# Patient Record
Sex: Female | Born: 1980
Health system: Southern US, Community
[De-identification: ages and names within clinical notes are randomized; demographics above are authoritative.]

---
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
## 2000-05-12 ENCOUNTER — Other Ambulatory Visit: Admission: RE | Admit: 2000-05-12 | Discharge: 2000-05-12 | Payer: Self-pay | Admitting: Family Medicine

## 2004-09-03 ENCOUNTER — Other Ambulatory Visit: Admission: RE | Admit: 2004-09-03 | Discharge: 2004-09-03 | Payer: Self-pay | Admitting: *Deleted

## 2013-10-12 HISTORY — PX: MINOR HEMORRHOIDECTOMY: SHX6238

## 2016-10-12 HISTORY — PX: REPAIR KNEE LIGAMENT: SUR1188

## 2017-07-29 ENCOUNTER — Ambulatory Visit (INDEPENDENT_AMBULATORY_CARE_PROVIDER_SITE_OTHER): Payer: BLUE CROSS/BLUE SHIELD | Admitting: Sports Medicine

## 2017-07-29 ENCOUNTER — Encounter: Payer: Self-pay | Admitting: Sports Medicine

## 2017-07-29 DIAGNOSIS — M2142 Flat foot [pes planus] (acquired), left foot: Secondary | ICD-10-CM | POA: Diagnosis not present

## 2017-07-29 DIAGNOSIS — M2141 Flat foot [pes planus] (acquired), right foot: Secondary | ICD-10-CM | POA: Diagnosis not present

## 2017-08-02 DIAGNOSIS — M2142 Flat foot [pes planus] (acquired), left foot: Principal | ICD-10-CM

## 2017-08-02 DIAGNOSIS — M2141 Flat foot [pes planus] (acquired), right foot: Secondary | ICD-10-CM | POA: Insufficient documentation

## 2017-08-02 NOTE — Progress Notes (Signed)
   HPI  CC: bilateral foot pain Patient is presenting for assessment and possible custom orthotics. Patient referred to us today by [facility]. Patient states long-standing bilateral foot pain. Pain is made worse with prolonged exercise and ambulation. She denies any recent trauma, injury, or fall. She denies any numbness, weakness, or paresthesias.  Medications/Interventions Tried: NSAIDs, PT  See HPI and/or previous note for associated ROS.  Objective: BP 100/70   Ht 5\' 6"  (1.676 m)   Wt 180 lb (81.6 kg)   BMI 29.05 kg/m  Gen: NAD, well groomed, a/o x3, normal affect.  CV: Well-perfused. Warm.  Resp: Non-labored.  Neuro: Sensation intact throughout. No gross coordination deficits.  Gait: Nonpathologic posture, unremarkable stride without signs of limp or balance issues. Ankle/Foot, Bilateral: TTP noted at the longitudinal arches, and medial plantar calcaneous. No visible erythema, swelling, ecchymosis, or bony deformity. Mild/Mod pes planus deformity. Transverse arch grossly intact; Mild tibiotalar valgus deviation; Range of motion is full in all directions. Strength is 5/5 in all directions. No tenderness at the insertion/body/myotendinous junction of the Achilles tendon; No peroneal tendon tenderness or subluxation; No tenderness on posterior aspects of lateral and medial malleolus; Stable lateral and medial ligaments; Talar dome nontender; Unremarkable calcaneal squeeze; No tenderness over the navicular prominence; No tenderness over cuboid; No pain at base of 5th MT; No tenderness at the distal metatarsals; Able to walk 4 steps.   Custom Orthotics: - Patient was fitted for a standard, cushioned, semi-rigid orthotic. - Orthotic was heated and afterward the patient stood on the orthotic blank positioned on the orthotic stand. - The patient was positioned in subtalar neutral position and 10 degrees of ankle dorsiflexion in a weight bearing stance. - After  completion of molding, a stable base was applied to the orthotic blank. - The blank was ground to a stable position for weight bearing. - Base: Blue EVA - Additional Posting and Padding: none;  - The patient ambulated in these, and they were very comfortable.    Assessment and plan:  Bilateral pes planus patient presenting with bilateral pes planus. Orthotics made today. - F/u PRN   I spent 30 minutes with this patient. Over 50% of visit was spent in counseling and coordination of care for problems with pes planus.   Kathee DeltonIan D McKeag, MD,MS [facility] 08/02/2017 7:41 AM

## 2017-08-02 NOTE — Assessment & Plan Note (Signed)
patient presenting with bilateral pes planus. Orthotics made today. - F/u PRN

## 2017-09-30 ENCOUNTER — Encounter: Payer: BLUE CROSS/BLUE SHIELD | Admitting: Sports Medicine

## 2018-11-17 DIAGNOSIS — L218 Other seborrheic dermatitis: Secondary | ICD-10-CM | POA: Diagnosis not present

## 2018-11-17 DIAGNOSIS — L814 Other melanin hyperpigmentation: Secondary | ICD-10-CM | POA: Diagnosis not present

## 2018-11-17 DIAGNOSIS — D225 Melanocytic nevi of trunk: Secondary | ICD-10-CM | POA: Diagnosis not present

## 2018-11-17 DIAGNOSIS — L810 Postinflammatory hyperpigmentation: Secondary | ICD-10-CM | POA: Diagnosis not present

## 2018-11-22 DIAGNOSIS — J309 Allergic rhinitis, unspecified: Secondary | ICD-10-CM | POA: Insufficient documentation

## 2018-12-02 DIAGNOSIS — M9902 Segmental and somatic dysfunction of thoracic region: Secondary | ICD-10-CM | POA: Diagnosis not present

## 2018-12-02 DIAGNOSIS — M9901 Segmental and somatic dysfunction of cervical region: Secondary | ICD-10-CM | POA: Diagnosis not present

## 2018-12-02 DIAGNOSIS — M546 Pain in thoracic spine: Secondary | ICD-10-CM | POA: Diagnosis not present

## 2018-12-02 DIAGNOSIS — M542 Cervicalgia: Secondary | ICD-10-CM | POA: Diagnosis not present

## 2018-12-30 DIAGNOSIS — M546 Pain in thoracic spine: Secondary | ICD-10-CM | POA: Diagnosis not present

## 2018-12-30 DIAGNOSIS — M542 Cervicalgia: Secondary | ICD-10-CM | POA: Diagnosis not present

## 2018-12-30 DIAGNOSIS — M9902 Segmental and somatic dysfunction of thoracic region: Secondary | ICD-10-CM | POA: Diagnosis not present

## 2018-12-30 DIAGNOSIS — M9901 Segmental and somatic dysfunction of cervical region: Secondary | ICD-10-CM | POA: Diagnosis not present

## 2019-01-27 DIAGNOSIS — M9901 Segmental and somatic dysfunction of cervical region: Secondary | ICD-10-CM | POA: Diagnosis not present

## 2019-01-27 DIAGNOSIS — M546 Pain in thoracic spine: Secondary | ICD-10-CM | POA: Diagnosis not present

## 2019-01-27 DIAGNOSIS — M542 Cervicalgia: Secondary | ICD-10-CM | POA: Diagnosis not present

## 2019-01-27 DIAGNOSIS — M9902 Segmental and somatic dysfunction of thoracic region: Secondary | ICD-10-CM | POA: Diagnosis not present

## 2019-02-22 DIAGNOSIS — M9901 Segmental and somatic dysfunction of cervical region: Secondary | ICD-10-CM | POA: Diagnosis not present

## 2019-02-22 DIAGNOSIS — M546 Pain in thoracic spine: Secondary | ICD-10-CM | POA: Diagnosis not present

## 2019-02-22 DIAGNOSIS — M9902 Segmental and somatic dysfunction of thoracic region: Secondary | ICD-10-CM | POA: Diagnosis not present

## 2019-02-22 DIAGNOSIS — M542 Cervicalgia: Secondary | ICD-10-CM | POA: Diagnosis not present

## 2019-03-02 DIAGNOSIS — M9902 Segmental and somatic dysfunction of thoracic region: Secondary | ICD-10-CM | POA: Diagnosis not present

## 2019-03-02 DIAGNOSIS — M9907 Segmental and somatic dysfunction of upper extremity: Secondary | ICD-10-CM | POA: Diagnosis not present

## 2019-03-02 DIAGNOSIS — M7542 Impingement syndrome of left shoulder: Secondary | ICD-10-CM | POA: Diagnosis not present

## 2019-03-02 DIAGNOSIS — M9901 Segmental and somatic dysfunction of cervical region: Secondary | ICD-10-CM | POA: Diagnosis not present

## 2019-03-07 DIAGNOSIS — M7542 Impingement syndrome of left shoulder: Secondary | ICD-10-CM | POA: Diagnosis not present

## 2019-03-07 DIAGNOSIS — M9907 Segmental and somatic dysfunction of upper extremity: Secondary | ICD-10-CM | POA: Diagnosis not present

## 2019-03-07 DIAGNOSIS — M9902 Segmental and somatic dysfunction of thoracic region: Secondary | ICD-10-CM | POA: Diagnosis not present

## 2019-03-07 DIAGNOSIS — M9901 Segmental and somatic dysfunction of cervical region: Secondary | ICD-10-CM | POA: Diagnosis not present

## 2019-03-14 DIAGNOSIS — M9901 Segmental and somatic dysfunction of cervical region: Secondary | ICD-10-CM | POA: Diagnosis not present

## 2019-03-14 DIAGNOSIS — M9902 Segmental and somatic dysfunction of thoracic region: Secondary | ICD-10-CM | POA: Diagnosis not present

## 2019-03-14 DIAGNOSIS — M7542 Impingement syndrome of left shoulder: Secondary | ICD-10-CM | POA: Diagnosis not present

## 2019-03-14 DIAGNOSIS — M9907 Segmental and somatic dysfunction of upper extremity: Secondary | ICD-10-CM | POA: Diagnosis not present

## 2019-03-24 DIAGNOSIS — M9907 Segmental and somatic dysfunction of upper extremity: Secondary | ICD-10-CM | POA: Diagnosis not present

## 2019-03-24 DIAGNOSIS — M9901 Segmental and somatic dysfunction of cervical region: Secondary | ICD-10-CM | POA: Diagnosis not present

## 2019-03-24 DIAGNOSIS — M7542 Impingement syndrome of left shoulder: Secondary | ICD-10-CM | POA: Diagnosis not present

## 2019-03-24 DIAGNOSIS — M9902 Segmental and somatic dysfunction of thoracic region: Secondary | ICD-10-CM | POA: Diagnosis not present

## 2019-03-31 DIAGNOSIS — M9901 Segmental and somatic dysfunction of cervical region: Secondary | ICD-10-CM | POA: Diagnosis not present

## 2019-03-31 DIAGNOSIS — M7542 Impingement syndrome of left shoulder: Secondary | ICD-10-CM | POA: Diagnosis not present

## 2019-03-31 DIAGNOSIS — M9902 Segmental and somatic dysfunction of thoracic region: Secondary | ICD-10-CM | POA: Diagnosis not present

## 2019-03-31 DIAGNOSIS — M9907 Segmental and somatic dysfunction of upper extremity: Secondary | ICD-10-CM | POA: Diagnosis not present

## 2019-04-13 DIAGNOSIS — M9907 Segmental and somatic dysfunction of upper extremity: Secondary | ICD-10-CM | POA: Diagnosis not present

## 2019-04-13 DIAGNOSIS — M7542 Impingement syndrome of left shoulder: Secondary | ICD-10-CM | POA: Diagnosis not present

## 2019-04-13 DIAGNOSIS — M9902 Segmental and somatic dysfunction of thoracic region: Secondary | ICD-10-CM | POA: Diagnosis not present

## 2019-04-13 DIAGNOSIS — M9901 Segmental and somatic dysfunction of cervical region: Secondary | ICD-10-CM | POA: Diagnosis not present

## 2019-04-28 DIAGNOSIS — M9907 Segmental and somatic dysfunction of upper extremity: Secondary | ICD-10-CM | POA: Diagnosis not present

## 2019-04-28 DIAGNOSIS — M9901 Segmental and somatic dysfunction of cervical region: Secondary | ICD-10-CM | POA: Diagnosis not present

## 2019-04-28 DIAGNOSIS — M9902 Segmental and somatic dysfunction of thoracic region: Secondary | ICD-10-CM | POA: Diagnosis not present

## 2019-04-28 DIAGNOSIS — M7542 Impingement syndrome of left shoulder: Secondary | ICD-10-CM | POA: Diagnosis not present

## 2019-05-19 DIAGNOSIS — M9902 Segmental and somatic dysfunction of thoracic region: Secondary | ICD-10-CM | POA: Diagnosis not present

## 2019-05-19 DIAGNOSIS — M9901 Segmental and somatic dysfunction of cervical region: Secondary | ICD-10-CM | POA: Diagnosis not present

## 2019-05-19 DIAGNOSIS — M7542 Impingement syndrome of left shoulder: Secondary | ICD-10-CM | POA: Diagnosis not present

## 2019-05-19 DIAGNOSIS — M9907 Segmental and somatic dysfunction of upper extremity: Secondary | ICD-10-CM | POA: Diagnosis not present

## 2019-06-09 DIAGNOSIS — M7542 Impingement syndrome of left shoulder: Secondary | ICD-10-CM | POA: Diagnosis not present

## 2019-06-09 DIAGNOSIS — M9901 Segmental and somatic dysfunction of cervical region: Secondary | ICD-10-CM | POA: Diagnosis not present

## 2019-06-09 DIAGNOSIS — M9902 Segmental and somatic dysfunction of thoracic region: Secondary | ICD-10-CM | POA: Diagnosis not present

## 2019-06-09 DIAGNOSIS — M9907 Segmental and somatic dysfunction of upper extremity: Secondary | ICD-10-CM | POA: Diagnosis not present

## 2019-06-23 DIAGNOSIS — M9907 Segmental and somatic dysfunction of upper extremity: Secondary | ICD-10-CM | POA: Diagnosis not present

## 2019-06-23 DIAGNOSIS — M7542 Impingement syndrome of left shoulder: Secondary | ICD-10-CM | POA: Diagnosis not present

## 2019-06-23 DIAGNOSIS — M9901 Segmental and somatic dysfunction of cervical region: Secondary | ICD-10-CM | POA: Diagnosis not present

## 2019-06-23 DIAGNOSIS — M9902 Segmental and somatic dysfunction of thoracic region: Secondary | ICD-10-CM | POA: Diagnosis not present

## 2019-07-20 DIAGNOSIS — M9902 Segmental and somatic dysfunction of thoracic region: Secondary | ICD-10-CM | POA: Diagnosis not present

## 2019-07-20 DIAGNOSIS — M9901 Segmental and somatic dysfunction of cervical region: Secondary | ICD-10-CM | POA: Diagnosis not present

## 2019-07-20 DIAGNOSIS — M9907 Segmental and somatic dysfunction of upper extremity: Secondary | ICD-10-CM | POA: Diagnosis not present

## 2019-07-20 DIAGNOSIS — M7542 Impingement syndrome of left shoulder: Secondary | ICD-10-CM | POA: Diagnosis not present

## 2019-07-26 DIAGNOSIS — R05 Cough: Secondary | ICD-10-CM | POA: Diagnosis not present

## 2019-07-26 DIAGNOSIS — Z20828 Contact with and (suspected) exposure to other viral communicable diseases: Secondary | ICD-10-CM | POA: Diagnosis not present

## 2019-07-27 DIAGNOSIS — Z20828 Contact with and (suspected) exposure to other viral communicable diseases: Secondary | ICD-10-CM | POA: Diagnosis not present

## 2019-09-07 ENCOUNTER — Emergency Department (HOSPITAL_COMMUNITY): Payer: BC Managed Care – PPO

## 2019-09-07 ENCOUNTER — Encounter (HOSPITAL_COMMUNITY): Payer: Self-pay | Admitting: Emergency Medicine

## 2019-09-07 ENCOUNTER — Emergency Department (HOSPITAL_COMMUNITY)
Admission: EM | Admit: 2019-09-07 | Discharge: 2019-09-07 | Disposition: A | Discharge status: Home / Self Care | Payer: BC Managed Care – PPO | Attending: Emergency Medicine | Admitting: Emergency Medicine

## 2019-09-07 DIAGNOSIS — Z23 Encounter for immunization: Secondary | ICD-10-CM | POA: Insufficient documentation

## 2019-09-07 DIAGNOSIS — S51851A Open bite of right forearm, initial encounter: Secondary | ICD-10-CM | POA: Insufficient documentation

## 2019-09-07 DIAGNOSIS — Y999 Unspecified external cause status: Secondary | ICD-10-CM | POA: Insufficient documentation

## 2019-09-07 DIAGNOSIS — W540XXA Bitten by dog, initial encounter: Secondary | ICD-10-CM | POA: Insufficient documentation

## 2019-09-07 DIAGNOSIS — Y939 Activity, unspecified: Secondary | ICD-10-CM | POA: Insufficient documentation

## 2019-09-07 DIAGNOSIS — S51051A Open bite, right elbow, initial encounter: Secondary | ICD-10-CM | POA: Diagnosis not present

## 2019-09-07 DIAGNOSIS — Y929 Unspecified place or not applicable: Secondary | ICD-10-CM | POA: Insufficient documentation

## 2019-09-07 MED ORDER — AMOXICILLIN-POT CLAVULANATE 875-125 MG PO TABS
1.0000 | ORAL_TABLET | Freq: Once | ORAL | Status: AC
  Administered 2019-09-07: 1 via ORAL
  Filled 2019-09-07: qty 1

## 2019-09-07 MED ORDER — LIDOCAINE-EPINEPHRINE-TETRACAINE (LET) TOPICAL GEL
3.0000 mL | Freq: Once | TOPICAL | Status: AC
  Administered 2019-09-07: 3 mL via TOPICAL
  Filled 2019-09-07: qty 3

## 2019-09-07 MED ORDER — LIDOCAINE HCL (PF) 1 % IJ SOLN
5.0000 mL | Freq: Once | INTRAMUSCULAR | Status: DC
  Filled 2019-09-07: qty 5

## 2019-09-07 MED ORDER — NAPROXEN 500 MG PO TABS: 500.0000 mg | ORAL_TABLET | Freq: Two times a day (BID) | ORAL | 0 refills | Status: DC

## 2019-09-07 MED ORDER — AMOXICILLIN-POT CLAVULANATE 875-125 MG PO TABS: 1.0000 | ORAL_TABLET | Freq: Two times a day (BID) | ORAL | 0 refills | Status: DC

## 2019-09-07 MED ORDER — TETANUS-DIPHTH-ACELL PERTUSSIS 5-2.5-18.5 LF-MCG/0.5 IM SUSP
0.5000 mL | Freq: Once | INTRAMUSCULAR | Status: AC
  Administered 2019-09-07: 0.5 mL via INTRAMUSCULAR
  Filled 2019-09-07: qty 0.5

## 2019-09-07 MED ORDER — OXYCODONE-ACETAMINOPHEN 5-325 MG PO TABS
1.0000 | ORAL_TABLET | Freq: Once | ORAL | Status: AC
  Administered 2019-09-07: 1 via ORAL
  Filled 2019-09-07: qty 1

## 2019-09-07 NOTE — ED Notes (Signed)
Patient verbalizes understanding of discharge instructions. Opportunity for questioning and answers were provided. Armband removed by staff, pt discharged from ED ambulatory.   

## 2019-09-07 NOTE — Discharge Instructions (Signed)
You were seen in the ER today after a dog bite to your right elbow.  Your xray did not show fractures/dislocations.  Your tetanus was updated.  As long as the dogs rabies vaccine is up to date you do not need rabies vaccination.  We are starting you on the following medicines:  - Augmentin- take twice per day to prevent infection.  - Naproxen is a nonsteroidal anti-inflammatory medication that will help with pain and swelling. Be sure to take this medication as prescribed with food, 1 pill every 12 hours,  It should be taken with food, as it can cause stomach upset, and more seriously, stomach bleeding. Do not take other nonsteroidal anti-inflammatory medications with this such as Advil, Motrin, Aleve, Mobic, Goodie Powder, or Motrin.    You make take Tylenol per over the counter dosing with these medications.   We have prescribed you new medication(s) today. Discuss the medications prescribed today with your pharmacist as they can have adverse effects and interactions with your other medicines including over the counter and prescribed medications. Seek medical evaluation if you start to experience new or abnormal symptoms after taking one of these medicines, seek care immediately if you start to experience difficulty breathing, feeling of your throat closing, facial swelling, or rash as these could be indications of a more serious allergic reaction  Please apply ice wrapped in a towel to the area 20 minutes on 40 minutes off for the next 48 hours to help with swelling.  Keep wounds clean and dry   Please follow up with primary care within 3 days for a recheck of the wounds.  Return to the ER for new or worsening symptoms including but not limited to increased pain, redness around wounds, drainage from wounds, fever, trouble moving the elbow, or any other concerns.

## 2019-09-07 NOTE — ED Provider Notes (Signed)
[facility] EMERGENCY DEPARTMENT Provider Note   CSN: [id number] Arrival date & time: 09/07/19  1433     History   Chief Complaint Chief Complaint  Patient presents with  . Animal Bite    HPI Daisy Taylor is a 38 y.o. female without significant past medical hx who presents to the ED for evaluation of dog bite to the R elbow which occurred shortly PTA today. Patient states her cousin's dog bit her R elbow area. She has 3 open wounds with discomfort & swelling. No alleviating/aggravating factors. No intervention PTA. L hand dominant. Denies fever, chills, numbness, tingling, or weakness. Unknown last tetanus. Reports dogs rabies vaccine is up to date- was vaccinated last month.      HPI  History reviewed. No pertinent past medical history.  Patient Active Problem List   Diagnosis Date Noted  . Bilateral pes planus 08/02/2017    History reviewed. No pertinent surgical history.   OB History   No obstetric history on file.      Home Medications    Prior to Admission medications   Not on File    Family History No family history on file.  Social History Social History   Tobacco Use  . Smoking status: Never Smoker  . Smokeless tobacco: Never Used  Substance Use Topics  . Alcohol use: Not on file  . Drug use: Not on file     Allergies   Bee venom   Review of Systems Review of Systems  Constitutional: Negative for chills and fever.  Gastrointestinal: Negative for abdominal pain, nausea and vomiting.  Musculoskeletal: Positive for arthralgias and joint swelling.  Skin: Positive for wound.  Neurological: Negative for weakness and numbness.    Physical Exam Updated Vital Signs BP 130/87 (BP Location: Left Arm)   Pulse (!) 108   Temp 99.1 F (37.3 C) (Oral)   Resp 20   SpO2 100%   Physical Exam Vitals signs and nursing note reviewed.  Constitutional:      General: She is not in acute distress.    Appearance: Normal  appearance. She is not ill-appearing or toxic-appearing.  HENT:     Head: Normocephalic and atraumatic.  Neck:     Musculoskeletal: Normal range of motion and neck supple.     Comments: No midline tenderness.  Cardiovascular:     Rate and Rhythm: Normal rate.     Pulses:          Radial pulses are 2+ on the right side and 2+ on the left side.  Pulmonary:     Effort: No respiratory distress.     Breath sounds: Normal breath sounds.  Musculoskeletal:     Comments: Upper extremities: R elbow: 3 wounds to medial elbow. Largest is most distal and is 1 cm in diameter. Additional two wounds are puncture like. Each appear to be a few mm deep. Larges with some SQ tissue exposed. Pictured below. No active bleeding. No appreciable FBs. Mild associated swelling/bruising present. Intact AROM throughout. Tender over wounds diffusely. Otherwise nontender. NVI distally.   Skin:    General: Skin is warm and dry.     Capillary Refill: Capillary refill takes less than 2 seconds.  Neurological:     Mental Status: She is alert.     Comments: Alert. Clear speech. Sensation grossly intact to bilateral upper extremities. 5/5 symmetric grip strength. Ambulatory. Able to perform OK sign, thumbs up, & cross 2nd/3rd digits bilaterally.   Psychiatric:  Mood and Affect: Mood normal.        Behavior: Behavior normal.          ED Treatments / Results  Labs (all labs ordered are listed, but only abnormal results are displayed) Labs Reviewed - No data to display  EKG None  Radiology Dg Elbow Complete Right  Result Date: 09/07/2019 CLINICAL DATA:  Pain. Dog bite. EXAM: RIGHT ELBOW - COMPLETE 3+ VIEW COMPARISON:  None. FINDINGS: There is a soft tissue defect involving the ulnar aspect of the elbow. There is no radiopaque foreign body. Subcutaneous gas is noted. There is associated soft tissue swelling. There is no acute displaced fracture or dislocation. There is no elbow joint effusion. IMPRESSION:  Soft tissue defect with subcutaneous gas. No radiopaque foreign body. No acute fracture or dislocation. No elbow joint effusion. Electronically Signed   By: Katherine Mantle M.D.   On: 09/07/2019 15:42    Procedures Procedures (including critical care time)  Medications Ordered in ED Medications  oxyCODONE-acetaminophen (PERCOCET/ROXICET) 5-325 MG per tablet 1 tablet (1 tablet Oral Given 09/07/19 1528)  lidocaine-EPINEPHrine-tetracaine (LET) topical gel (3 mLs Topical Given 09/07/19 1535)  Tdap (BOOSTRIX) injection 0.5 mL (0.5 mLs Intramuscular Given 09/07/19 1528)  amoxicillin-clavulanate (AUGMENTIN) 875-125 MG per tablet 1 tablet (1 tablet Oral Given 09/07/19 1528)    Initial Impression / Assessment and Plan / ED Course  I have reviewed the triage vital signs and the nursing notes.  Pertinent labs & imaging results that were available during my care of the patient were reviewed by me and considered in my medical decision making (see chart for details).   Patient presents to the ED s/p dog bite injury to the right elbow.  Wounds cleansed with betadine, pressure irrigated with 1L of sterile water, visualized in bloodless field without evidence of FB. Given not on the face or overly large closure with sutures was deferred which patient was in agreement with. Abx ointment and bandages applied. NVI distally. Tetanus updated. Dog's rabies vaccines were reported up to date. Start on augmentin, naproxen prescription for pain/swelling. I discussed results, treatment plan, need for follow-up, and return precautions with the patient. Provided opportunity for questions, patient confirmed understanding and is in agreement with plan.    Final Clinical Impressions(s) / ED Diagnoses   Final diagnoses:  Dog bite, initial encounter    ED Discharge Orders         Ordered    amoxicillin-clavulanate (AUGMENTIN) 875-125 MG tablet  Every 12 hours     09/07/19 1610    naproxen (NAPROSYN) 500 MG tablet   2 times daily     09/07/19 [address], [place], PA-C 09/07/19 1626    Arby Barrette, MD 09/08/19 2359

## 2019-09-07 NOTE — ED Triage Notes (Signed)
Pt in w/dog bite to R arm, 3 marks present to R elbow region. Pt states it was family member's bulldog, up to date with all vaccinations. Bleeding controlled

## 2019-09-11 DIAGNOSIS — L03119 Cellulitis of unspecified part of limb: Secondary | ICD-10-CM | POA: Diagnosis not present

## 2019-09-11 DIAGNOSIS — Z6830 Body mass index (BMI) 30.0-30.9, adult: Secondary | ICD-10-CM | POA: Diagnosis not present

## 2019-09-11 DIAGNOSIS — S41159A Open bite of unspecified upper arm, initial encounter: Secondary | ICD-10-CM | POA: Diagnosis not present

## 2019-09-13 DIAGNOSIS — L03119 Cellulitis of unspecified part of limb: Secondary | ICD-10-CM | POA: Diagnosis not present

## 2019-09-13 DIAGNOSIS — S41159A Open bite of unspecified upper arm, initial encounter: Secondary | ICD-10-CM | POA: Diagnosis not present

## 2019-09-13 DIAGNOSIS — Z6831 Body mass index (BMI) 31.0-31.9, adult: Secondary | ICD-10-CM | POA: Diagnosis not present

## 2019-09-29 DIAGNOSIS — M9907 Segmental and somatic dysfunction of upper extremity: Secondary | ICD-10-CM | POA: Diagnosis not present

## 2019-09-29 DIAGNOSIS — M7542 Impingement syndrome of left shoulder: Secondary | ICD-10-CM | POA: Diagnosis not present

## 2019-09-29 DIAGNOSIS — M9902 Segmental and somatic dysfunction of thoracic region: Secondary | ICD-10-CM | POA: Diagnosis not present

## 2019-09-29 DIAGNOSIS — M9901 Segmental and somatic dysfunction of cervical region: Secondary | ICD-10-CM | POA: Diagnosis not present

## 2019-10-19 DIAGNOSIS — M5412 Radiculopathy, cervical region: Secondary | ICD-10-CM | POA: Diagnosis not present

## 2019-10-24 DIAGNOSIS — M542 Cervicalgia: Secondary | ICD-10-CM | POA: Diagnosis not present

## 2019-10-26 DIAGNOSIS — M5000 Cervical disc disorder with myelopathy, unspecified cervical region: Secondary | ICD-10-CM | POA: Diagnosis not present

## 2019-10-27 DIAGNOSIS — M9901 Segmental and somatic dysfunction of cervical region: Secondary | ICD-10-CM | POA: Diagnosis not present

## 2019-10-27 DIAGNOSIS — M7542 Impingement syndrome of left shoulder: Secondary | ICD-10-CM | POA: Diagnosis not present

## 2019-10-27 DIAGNOSIS — M9907 Segmental and somatic dysfunction of upper extremity: Secondary | ICD-10-CM | POA: Diagnosis not present

## 2019-10-27 DIAGNOSIS — M9902 Segmental and somatic dysfunction of thoracic region: Secondary | ICD-10-CM | POA: Diagnosis not present

## 2019-11-08 DIAGNOSIS — M9901 Segmental and somatic dysfunction of cervical region: Secondary | ICD-10-CM | POA: Diagnosis not present

## 2019-11-08 DIAGNOSIS — M9907 Segmental and somatic dysfunction of upper extremity: Secondary | ICD-10-CM | POA: Diagnosis not present

## 2019-11-08 DIAGNOSIS — M9902 Segmental and somatic dysfunction of thoracic region: Secondary | ICD-10-CM | POA: Diagnosis not present

## 2019-11-08 DIAGNOSIS — M7542 Impingement syndrome of left shoulder: Secondary | ICD-10-CM | POA: Diagnosis not present

## 2019-11-21 DIAGNOSIS — M9901 Segmental and somatic dysfunction of cervical region: Secondary | ICD-10-CM | POA: Diagnosis not present

## 2019-11-21 DIAGNOSIS — M9907 Segmental and somatic dysfunction of upper extremity: Secondary | ICD-10-CM | POA: Diagnosis not present

## 2019-11-21 DIAGNOSIS — M7542 Impingement syndrome of left shoulder: Secondary | ICD-10-CM | POA: Diagnosis not present

## 2019-11-21 DIAGNOSIS — M9902 Segmental and somatic dysfunction of thoracic region: Secondary | ICD-10-CM | POA: Diagnosis not present

## 2019-11-27 DIAGNOSIS — L814 Other melanin hyperpigmentation: Secondary | ICD-10-CM | POA: Diagnosis not present

## 2019-11-27 DIAGNOSIS — L810 Postinflammatory hyperpigmentation: Secondary | ICD-10-CM | POA: Diagnosis not present

## 2019-11-27 DIAGNOSIS — D225 Melanocytic nevi of trunk: Secondary | ICD-10-CM | POA: Diagnosis not present

## 2019-11-27 DIAGNOSIS — L218 Other seborrheic dermatitis: Secondary | ICD-10-CM | POA: Diagnosis not present

## 2019-12-13 DIAGNOSIS — M9901 Segmental and somatic dysfunction of cervical region: Secondary | ICD-10-CM | POA: Diagnosis not present

## 2019-12-13 DIAGNOSIS — M7542 Impingement syndrome of left shoulder: Secondary | ICD-10-CM | POA: Diagnosis not present

## 2019-12-13 DIAGNOSIS — M9907 Segmental and somatic dysfunction of upper extremity: Secondary | ICD-10-CM | POA: Diagnosis not present

## 2019-12-13 DIAGNOSIS — M9902 Segmental and somatic dysfunction of thoracic region: Secondary | ICD-10-CM | POA: Diagnosis not present

## 2019-12-28 DIAGNOSIS — M9907 Segmental and somatic dysfunction of upper extremity: Secondary | ICD-10-CM | POA: Diagnosis not present

## 2019-12-28 DIAGNOSIS — M9902 Segmental and somatic dysfunction of thoracic region: Secondary | ICD-10-CM | POA: Diagnosis not present

## 2019-12-28 DIAGNOSIS — M9901 Segmental and somatic dysfunction of cervical region: Secondary | ICD-10-CM | POA: Diagnosis not present

## 2019-12-28 DIAGNOSIS — M7542 Impingement syndrome of left shoulder: Secondary | ICD-10-CM | POA: Diagnosis not present

## 2020-01-16 DIAGNOSIS — M9902 Segmental and somatic dysfunction of thoracic region: Secondary | ICD-10-CM | POA: Diagnosis not present

## 2020-01-16 DIAGNOSIS — M9901 Segmental and somatic dysfunction of cervical region: Secondary | ICD-10-CM | POA: Diagnosis not present

## 2020-01-16 DIAGNOSIS — M9907 Segmental and somatic dysfunction of upper extremity: Secondary | ICD-10-CM | POA: Diagnosis not present

## 2020-01-16 DIAGNOSIS — M7542 Impingement syndrome of left shoulder: Secondary | ICD-10-CM | POA: Diagnosis not present

## 2020-01-18 ENCOUNTER — Ambulatory Visit: Payer: BC Managed Care – PPO | Attending: Internal Medicine

## 2020-01-18 DIAGNOSIS — Z23 Encounter for immunization: Secondary | ICD-10-CM

## 2020-01-18 NOTE — Progress Notes (Signed)
   Covid-19 Vaccination Clinic  Name:  ENAYA HOWZE    MRN: [id number] DOB: 1981-06-26  01/18/2020  Ms. Dowell was observed post Covid-19 immunization for 15 minutes without incident. She was provided with Vaccine Information Sheet and instruction to access the V-Safe system.   Ms. Cofield was instructed to call 911 with any severe reactions post vaccine: Marland Kitchen Difficulty breathing  . Swelling of face and throat  . A fast heartbeat  . A bad rash all over body  . Dizziness and weakness   Immunizations Administered    Name Date Dose VIS Date Route   Pfizer COVID-19 Vaccine 01/18/2020  1:05 PM 0.3 mL 09/22/2019 Intramuscular   Manufacturer: [employer], [employer]   Lot: [id number]   NDC: [address]-3

## 2020-01-22 DIAGNOSIS — L810 Postinflammatory hyperpigmentation: Secondary | ICD-10-CM | POA: Diagnosis not present

## 2020-01-22 DIAGNOSIS — L218 Other seborrheic dermatitis: Secondary | ICD-10-CM | POA: Diagnosis not present

## 2020-01-22 DIAGNOSIS — L659 Nonscarring hair loss, unspecified: Secondary | ICD-10-CM | POA: Diagnosis not present

## 2020-01-22 DIAGNOSIS — L811 Chloasma: Secondary | ICD-10-CM | POA: Diagnosis not present

## 2020-01-30 DIAGNOSIS — M7542 Impingement syndrome of left shoulder: Secondary | ICD-10-CM | POA: Diagnosis not present

## 2020-01-30 DIAGNOSIS — M9907 Segmental and somatic dysfunction of upper extremity: Secondary | ICD-10-CM | POA: Diagnosis not present

## 2020-01-30 DIAGNOSIS — M9902 Segmental and somatic dysfunction of thoracic region: Secondary | ICD-10-CM | POA: Diagnosis not present

## 2020-01-30 DIAGNOSIS — M9901 Segmental and somatic dysfunction of cervical region: Secondary | ICD-10-CM | POA: Diagnosis not present

## 2020-02-12 ENCOUNTER — Ambulatory Visit: Payer: BC Managed Care – PPO | Attending: Internal Medicine

## 2020-02-12 DIAGNOSIS — Z23 Encounter for immunization: Secondary | ICD-10-CM

## 2020-02-12 NOTE — Progress Notes (Signed)
   Covid-19 Vaccination Clinic  Name:  Daisy Taylor    MRN: [id number] DOB: Dec 27, 1980  02/12/2020  [name] was observed post Covid-19 immunization for 15 minutes without incident. She was provided with Vaccine Information Sheet and instruction to access the V-Safe system.   [name] was instructed to call 911 with any severe reactions post vaccine: Marland Kitchen Difficulty breathing  . Swelling of face and throat  . A fast heartbeat  . A bad rash all over body  . Dizziness and weakness   Immunizations Administered    Name Date Dose VIS Date Route   Pfizer COVID-19 Vaccine 02/12/2020  2:18 PM 0.3 mL 12/06/2018 Intramuscular   Manufacturer: [employer], [employer]   Lot: [id number]   NDC: [address]-3

## 2020-02-27 DIAGNOSIS — M9901 Segmental and somatic dysfunction of cervical region: Secondary | ICD-10-CM | POA: Diagnosis not present

## 2020-02-27 DIAGNOSIS — M7542 Impingement syndrome of left shoulder: Secondary | ICD-10-CM | POA: Diagnosis not present

## 2020-02-27 DIAGNOSIS — M9902 Segmental and somatic dysfunction of thoracic region: Secondary | ICD-10-CM | POA: Diagnosis not present

## 2020-02-27 DIAGNOSIS — M9907 Segmental and somatic dysfunction of upper extremity: Secondary | ICD-10-CM | POA: Diagnosis not present

## 2020-03-12 DIAGNOSIS — M7542 Impingement syndrome of left shoulder: Secondary | ICD-10-CM | POA: Diagnosis not present

## 2020-03-12 DIAGNOSIS — M9902 Segmental and somatic dysfunction of thoracic region: Secondary | ICD-10-CM | POA: Diagnosis not present

## 2020-03-12 DIAGNOSIS — M9907 Segmental and somatic dysfunction of upper extremity: Secondary | ICD-10-CM | POA: Diagnosis not present

## 2020-03-12 DIAGNOSIS — M9901 Segmental and somatic dysfunction of cervical region: Secondary | ICD-10-CM | POA: Diagnosis not present

## 2020-04-02 DIAGNOSIS — M9902 Segmental and somatic dysfunction of thoracic region: Secondary | ICD-10-CM | POA: Diagnosis not present

## 2020-04-02 DIAGNOSIS — M7542 Impingement syndrome of left shoulder: Secondary | ICD-10-CM | POA: Diagnosis not present

## 2020-04-02 DIAGNOSIS — M9901 Segmental and somatic dysfunction of cervical region: Secondary | ICD-10-CM | POA: Diagnosis not present

## 2020-04-02 DIAGNOSIS — M9907 Segmental and somatic dysfunction of upper extremity: Secondary | ICD-10-CM | POA: Diagnosis not present

## 2020-04-03 DIAGNOSIS — L811 Chloasma: Secondary | ICD-10-CM | POA: Diagnosis not present

## 2020-04-03 DIAGNOSIS — L218 Other seborrheic dermatitis: Secondary | ICD-10-CM | POA: Diagnosis not present

## 2020-04-03 DIAGNOSIS — L659 Nonscarring hair loss, unspecified: Secondary | ICD-10-CM | POA: Diagnosis not present

## 2020-04-03 DIAGNOSIS — L810 Postinflammatory hyperpigmentation: Secondary | ICD-10-CM | POA: Diagnosis not present

## 2020-04-23 DIAGNOSIS — M9901 Segmental and somatic dysfunction of cervical region: Secondary | ICD-10-CM | POA: Diagnosis not present

## 2020-04-23 DIAGNOSIS — M7542 Impingement syndrome of left shoulder: Secondary | ICD-10-CM | POA: Diagnosis not present

## 2020-04-23 DIAGNOSIS — M9907 Segmental and somatic dysfunction of upper extremity: Secondary | ICD-10-CM | POA: Diagnosis not present

## 2020-04-23 DIAGNOSIS — M9902 Segmental and somatic dysfunction of thoracic region: Secondary | ICD-10-CM | POA: Diagnosis not present

## 2020-05-07 DIAGNOSIS — M9902 Segmental and somatic dysfunction of thoracic region: Secondary | ICD-10-CM | POA: Diagnosis not present

## 2020-05-07 DIAGNOSIS — M7542 Impingement syndrome of left shoulder: Secondary | ICD-10-CM | POA: Diagnosis not present

## 2020-05-07 DIAGNOSIS — M9907 Segmental and somatic dysfunction of upper extremity: Secondary | ICD-10-CM | POA: Diagnosis not present

## 2020-05-07 DIAGNOSIS — M9901 Segmental and somatic dysfunction of cervical region: Secondary | ICD-10-CM | POA: Diagnosis not present

## 2020-05-28 DIAGNOSIS — M9907 Segmental and somatic dysfunction of upper extremity: Secondary | ICD-10-CM | POA: Diagnosis not present

## 2020-05-28 DIAGNOSIS — M9901 Segmental and somatic dysfunction of cervical region: Secondary | ICD-10-CM | POA: Diagnosis not present

## 2020-05-28 DIAGNOSIS — M9902 Segmental and somatic dysfunction of thoracic region: Secondary | ICD-10-CM | POA: Diagnosis not present

## 2020-05-28 DIAGNOSIS — M7542 Impingement syndrome of left shoulder: Secondary | ICD-10-CM | POA: Diagnosis not present

## 2020-06-25 DIAGNOSIS — M9907 Segmental and somatic dysfunction of upper extremity: Secondary | ICD-10-CM | POA: Diagnosis not present

## 2020-06-25 DIAGNOSIS — M9901 Segmental and somatic dysfunction of cervical region: Secondary | ICD-10-CM | POA: Diagnosis not present

## 2020-06-25 DIAGNOSIS — M7542 Impingement syndrome of left shoulder: Secondary | ICD-10-CM | POA: Diagnosis not present

## 2020-06-25 DIAGNOSIS — M9902 Segmental and somatic dysfunction of thoracic region: Secondary | ICD-10-CM | POA: Diagnosis not present

## 2020-07-09 DIAGNOSIS — M7542 Impingement syndrome of left shoulder: Secondary | ICD-10-CM | POA: Diagnosis not present

## 2020-07-09 DIAGNOSIS — M9902 Segmental and somatic dysfunction of thoracic region: Secondary | ICD-10-CM | POA: Diagnosis not present

## 2020-07-09 DIAGNOSIS — M9907 Segmental and somatic dysfunction of upper extremity: Secondary | ICD-10-CM | POA: Diagnosis not present

## 2020-07-09 DIAGNOSIS — M9901 Segmental and somatic dysfunction of cervical region: Secondary | ICD-10-CM | POA: Diagnosis not present

## 2020-07-31 DIAGNOSIS — M7542 Impingement syndrome of left shoulder: Secondary | ICD-10-CM | POA: Diagnosis not present

## 2020-07-31 DIAGNOSIS — M9901 Segmental and somatic dysfunction of cervical region: Secondary | ICD-10-CM | POA: Diagnosis not present

## 2020-07-31 DIAGNOSIS — M9902 Segmental and somatic dysfunction of thoracic region: Secondary | ICD-10-CM | POA: Diagnosis not present

## 2020-07-31 DIAGNOSIS — M9907 Segmental and somatic dysfunction of upper extremity: Secondary | ICD-10-CM | POA: Diagnosis not present

## 2020-08-21 DIAGNOSIS — M7542 Impingement syndrome of left shoulder: Secondary | ICD-10-CM | POA: Diagnosis not present

## 2020-08-21 DIAGNOSIS — M9901 Segmental and somatic dysfunction of cervical region: Secondary | ICD-10-CM | POA: Diagnosis not present

## 2020-08-21 DIAGNOSIS — M9902 Segmental and somatic dysfunction of thoracic region: Secondary | ICD-10-CM | POA: Diagnosis not present

## 2020-08-21 DIAGNOSIS — M9907 Segmental and somatic dysfunction of upper extremity: Secondary | ICD-10-CM | POA: Diagnosis not present

## 2020-08-23 ENCOUNTER — Other Ambulatory Visit (HOSPITAL_COMMUNITY)
Admission: RE | Admit: 2020-08-23 | Discharge: 2020-08-23 | Disposition: A | Discharge status: Home / Self Care | Payer: BC Managed Care – PPO | Source: Ambulatory Visit | Attending: Family Medicine | Admitting: Family Medicine

## 2020-08-23 ENCOUNTER — Other Ambulatory Visit: Payer: Self-pay | Admitting: Family Medicine

## 2020-08-23 DIAGNOSIS — Z13220 Encounter for screening for lipoid disorders: Secondary | ICD-10-CM | POA: Diagnosis not present

## 2020-08-23 DIAGNOSIS — Z113 Encounter for screening for infections with a predominantly sexual mode of transmission: Secondary | ICD-10-CM | POA: Diagnosis not present

## 2020-08-23 DIAGNOSIS — Z124 Encounter for screening for malignant neoplasm of cervix: Secondary | ICD-10-CM | POA: Insufficient documentation

## 2020-08-23 DIAGNOSIS — Z0000 Encounter for general adult medical examination without abnormal findings: Secondary | ICD-10-CM | POA: Diagnosis not present

## 2020-08-23 DIAGNOSIS — Z23 Encounter for immunization: Secondary | ICD-10-CM | POA: Diagnosis not present

## 2020-09-09 LAB — CYTOLOGY - PAP
Comment: NEGATIVE
Diagnosis: NEGATIVE
High risk HPV: NEGATIVE

## 2020-09-11 DIAGNOSIS — M9901 Segmental and somatic dysfunction of cervical region: Secondary | ICD-10-CM | POA: Diagnosis not present

## 2020-09-11 DIAGNOSIS — M7542 Impingement syndrome of left shoulder: Secondary | ICD-10-CM | POA: Diagnosis not present

## 2020-09-11 DIAGNOSIS — M9907 Segmental and somatic dysfunction of upper extremity: Secondary | ICD-10-CM | POA: Diagnosis not present

## 2020-09-11 DIAGNOSIS — M9902 Segmental and somatic dysfunction of thoracic region: Secondary | ICD-10-CM | POA: Diagnosis not present

## 2020-10-02 DIAGNOSIS — M9902 Segmental and somatic dysfunction of thoracic region: Secondary | ICD-10-CM | POA: Diagnosis not present

## 2020-10-02 DIAGNOSIS — M7542 Impingement syndrome of left shoulder: Secondary | ICD-10-CM | POA: Diagnosis not present

## 2020-10-02 DIAGNOSIS — M9907 Segmental and somatic dysfunction of upper extremity: Secondary | ICD-10-CM | POA: Diagnosis not present

## 2020-10-02 DIAGNOSIS — M9901 Segmental and somatic dysfunction of cervical region: Secondary | ICD-10-CM | POA: Diagnosis not present

## 2020-10-21 DIAGNOSIS — M7542 Impingement syndrome of left shoulder: Secondary | ICD-10-CM | POA: Diagnosis not present

## 2020-10-21 DIAGNOSIS — M9907 Segmental and somatic dysfunction of upper extremity: Secondary | ICD-10-CM | POA: Diagnosis not present

## 2020-10-21 DIAGNOSIS — M9902 Segmental and somatic dysfunction of thoracic region: Secondary | ICD-10-CM | POA: Diagnosis not present

## 2020-10-21 DIAGNOSIS — M9901 Segmental and somatic dysfunction of cervical region: Secondary | ICD-10-CM | POA: Diagnosis not present

## 2020-11-18 DIAGNOSIS — M7542 Impingement syndrome of left shoulder: Secondary | ICD-10-CM | POA: Diagnosis not present

## 2020-11-18 DIAGNOSIS — M9901 Segmental and somatic dysfunction of cervical region: Secondary | ICD-10-CM | POA: Diagnosis not present

## 2020-11-18 DIAGNOSIS — M9907 Segmental and somatic dysfunction of upper extremity: Secondary | ICD-10-CM | POA: Diagnosis not present

## 2020-11-18 DIAGNOSIS — M9902 Segmental and somatic dysfunction of thoracic region: Secondary | ICD-10-CM | POA: Diagnosis not present

## 2020-11-25 DIAGNOSIS — Z20822 Contact with and (suspected) exposure to covid-19: Secondary | ICD-10-CM | POA: Diagnosis not present

## 2020-12-16 DIAGNOSIS — M9907 Segmental and somatic dysfunction of upper extremity: Secondary | ICD-10-CM | POA: Diagnosis not present

## 2020-12-16 DIAGNOSIS — M7542 Impingement syndrome of left shoulder: Secondary | ICD-10-CM | POA: Diagnosis not present

## 2020-12-16 DIAGNOSIS — M9901 Segmental and somatic dysfunction of cervical region: Secondary | ICD-10-CM | POA: Diagnosis not present

## 2020-12-16 DIAGNOSIS — M9902 Segmental and somatic dysfunction of thoracic region: Secondary | ICD-10-CM | POA: Diagnosis not present

## 2021-01-06 DIAGNOSIS — M9901 Segmental and somatic dysfunction of cervical region: Secondary | ICD-10-CM | POA: Diagnosis not present

## 2021-01-06 DIAGNOSIS — M9907 Segmental and somatic dysfunction of upper extremity: Secondary | ICD-10-CM | POA: Diagnosis not present

## 2021-01-06 DIAGNOSIS — M9902 Segmental and somatic dysfunction of thoracic region: Secondary | ICD-10-CM | POA: Diagnosis not present

## 2021-01-06 DIAGNOSIS — M7542 Impingement syndrome of left shoulder: Secondary | ICD-10-CM | POA: Diagnosis not present

## 2021-03-21 DIAGNOSIS — M9907 Segmental and somatic dysfunction of upper extremity: Secondary | ICD-10-CM | POA: Diagnosis not present

## 2021-03-21 DIAGNOSIS — M9902 Segmental and somatic dysfunction of thoracic region: Secondary | ICD-10-CM | POA: Diagnosis not present

## 2021-03-21 DIAGNOSIS — M9901 Segmental and somatic dysfunction of cervical region: Secondary | ICD-10-CM | POA: Diagnosis not present

## 2021-03-21 DIAGNOSIS — M7542 Impingement syndrome of left shoulder: Secondary | ICD-10-CM | POA: Diagnosis not present

## 2021-03-23 IMAGING — DX DG ELBOW COMPLETE 3+V*R*
4 series · 4 of 4 positions shown · non-contrast
Comparison: None.

CLINICAL DATA: Pain. Dog bite.

EXAM:
RIGHT ELBOW - COMPLETE 3+ VIEW

[elbow ap]
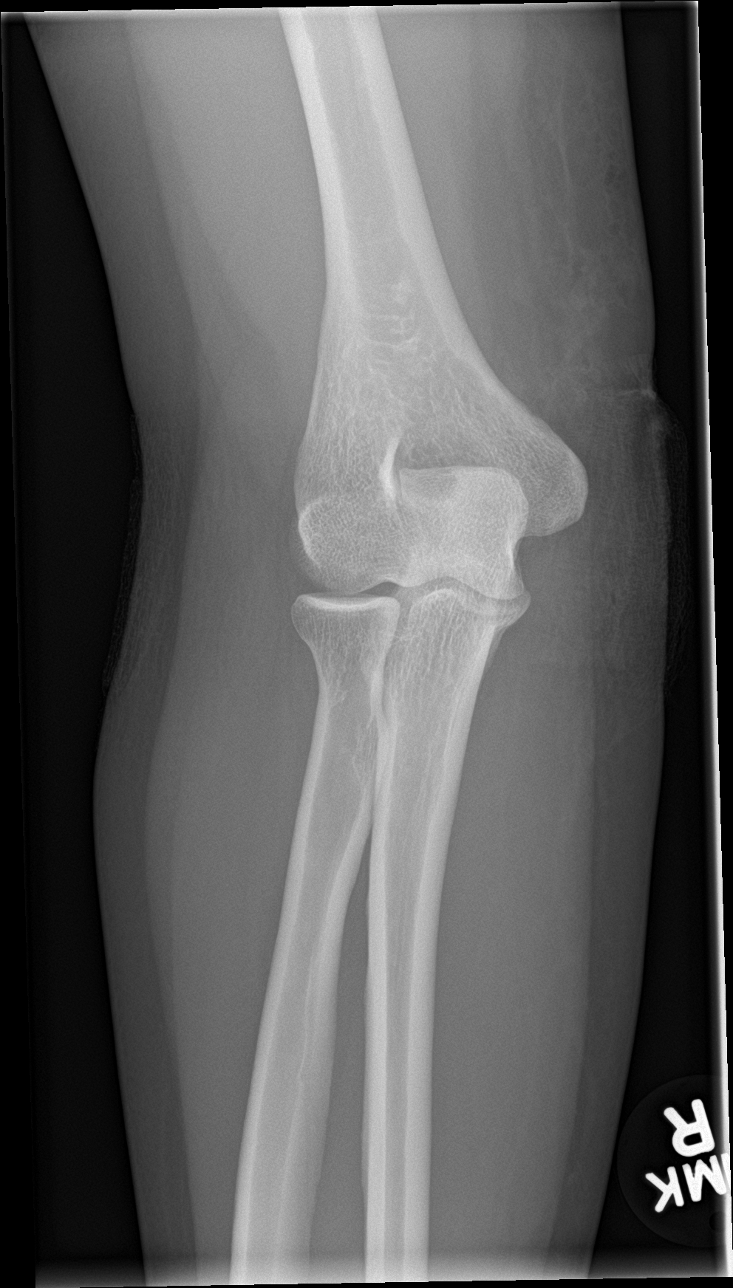

[elbow obl (1 of 2)]
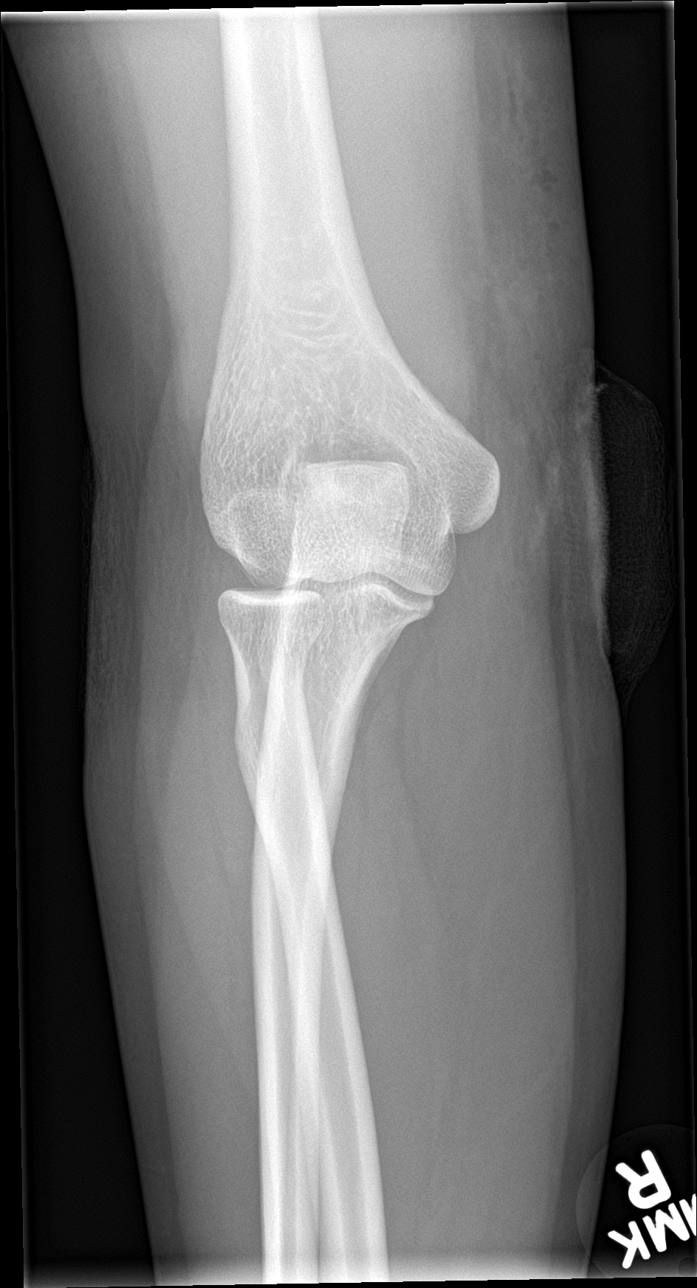

[elbow obl (2 of 2)]
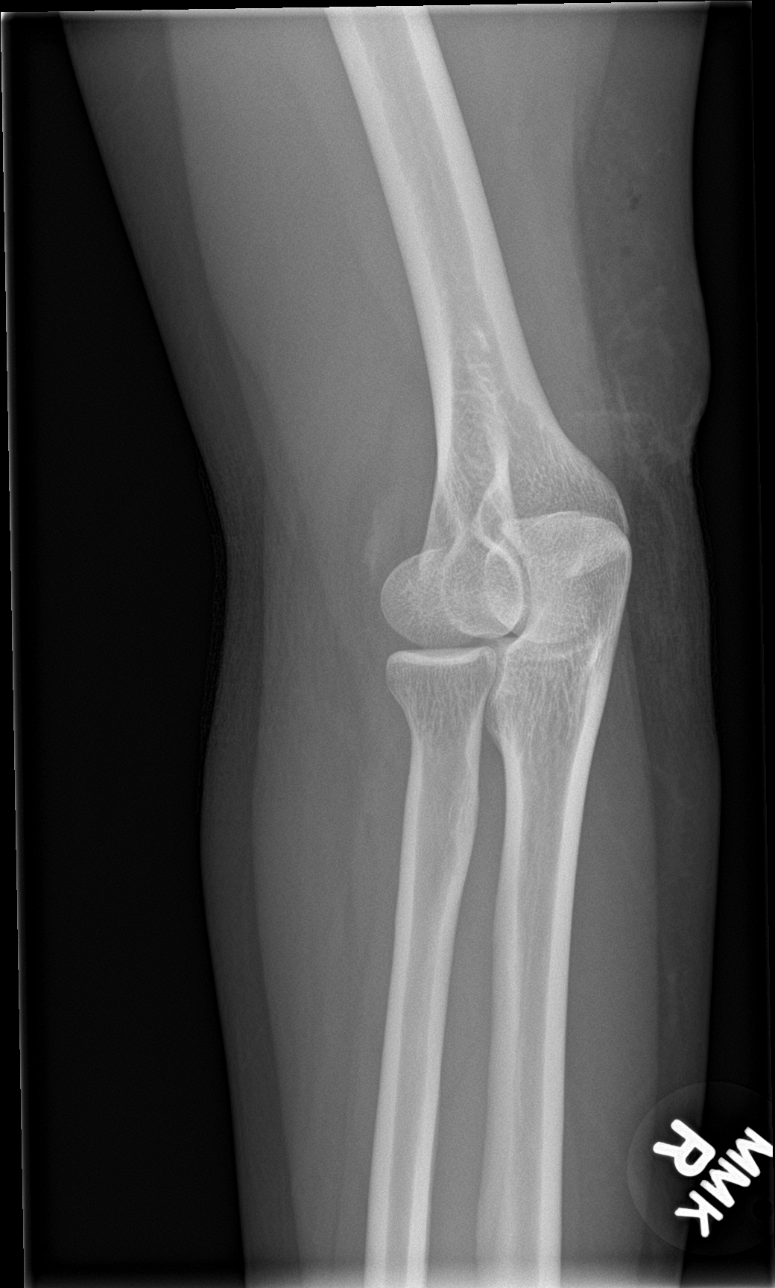

[elbow lat]
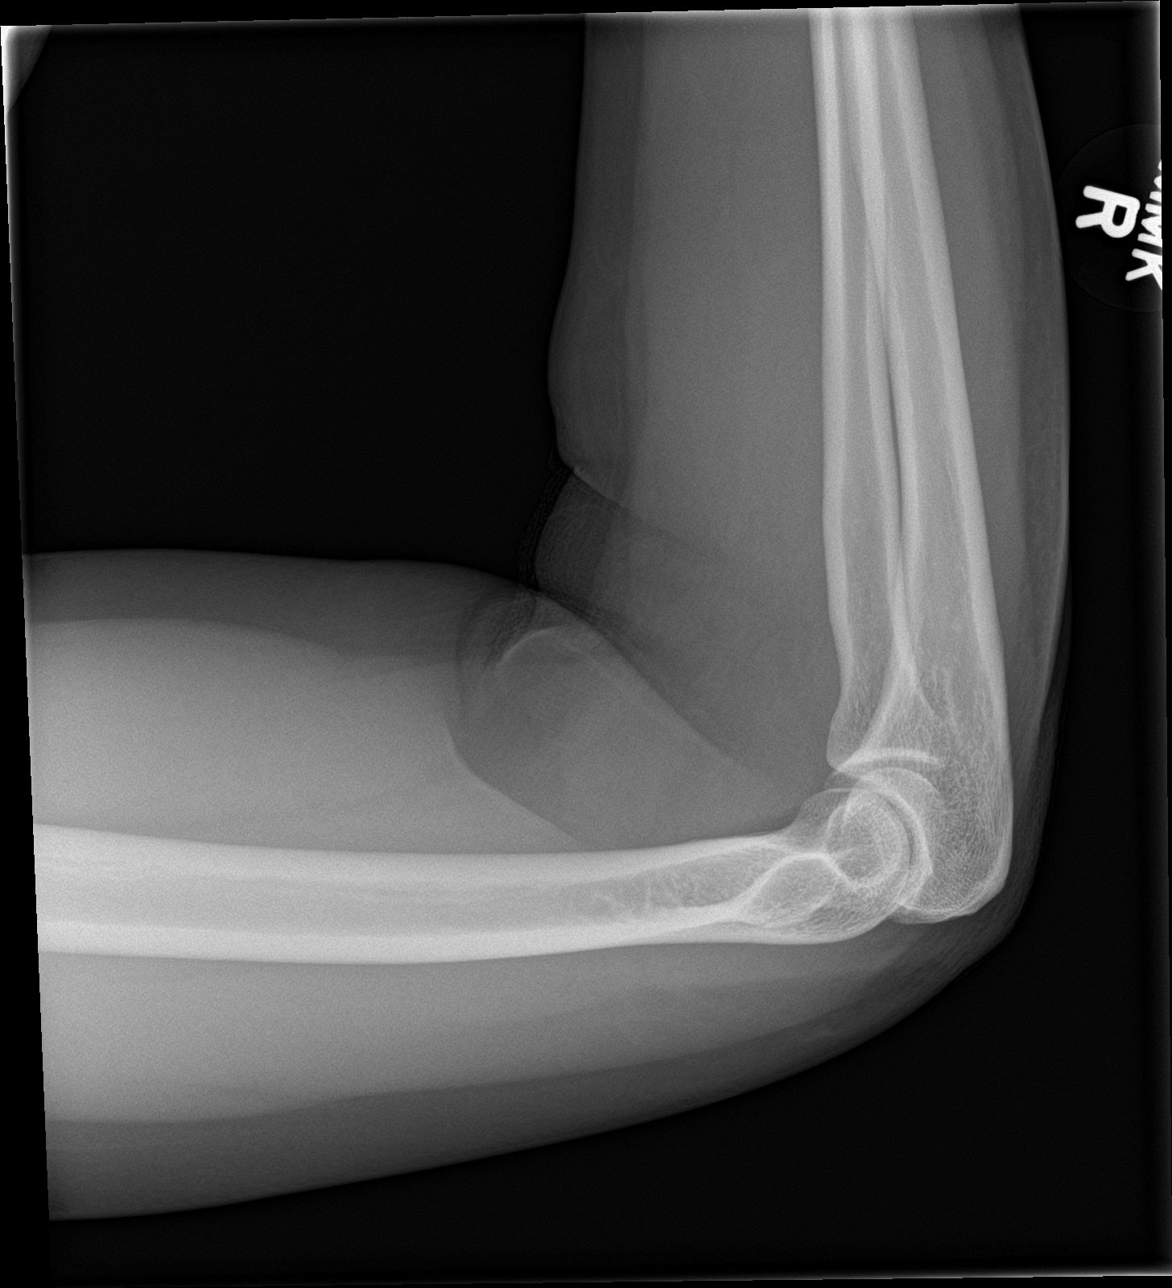

[4 of 4 positions shown; findings below may reference images not displayed]

FINDINGS: There is a soft tissue defect involving the ulnar aspect of the
elbow. There is no radiopaque foreign body. Subcutaneous gas is
noted. There is associated soft tissue swelling. There is no acute
displaced fracture or dislocation. There is no elbow joint effusion.
IMPRESSION: Soft tissue defect with subcutaneous gas. No radiopaque foreign
body. No acute fracture or dislocation. No elbow joint effusion.

## 2021-04-18 DIAGNOSIS — M9901 Segmental and somatic dysfunction of cervical region: Secondary | ICD-10-CM | POA: Diagnosis not present

## 2021-04-18 DIAGNOSIS — M9902 Segmental and somatic dysfunction of thoracic region: Secondary | ICD-10-CM | POA: Diagnosis not present

## 2021-04-18 DIAGNOSIS — M7542 Impingement syndrome of left shoulder: Secondary | ICD-10-CM | POA: Diagnosis not present

## 2021-04-18 DIAGNOSIS — M9907 Segmental and somatic dysfunction of upper extremity: Secondary | ICD-10-CM | POA: Diagnosis not present

## 2021-05-09 DIAGNOSIS — M9901 Segmental and somatic dysfunction of cervical region: Secondary | ICD-10-CM | POA: Diagnosis not present

## 2021-05-09 DIAGNOSIS — M9907 Segmental and somatic dysfunction of upper extremity: Secondary | ICD-10-CM | POA: Diagnosis not present

## 2021-05-09 DIAGNOSIS — M7542 Impingement syndrome of left shoulder: Secondary | ICD-10-CM | POA: Diagnosis not present

## 2021-05-09 DIAGNOSIS — M9902 Segmental and somatic dysfunction of thoracic region: Secondary | ICD-10-CM | POA: Diagnosis not present

## 2021-05-23 DIAGNOSIS — M9901 Segmental and somatic dysfunction of cervical region: Secondary | ICD-10-CM | POA: Diagnosis not present

## 2021-05-23 DIAGNOSIS — M9907 Segmental and somatic dysfunction of upper extremity: Secondary | ICD-10-CM | POA: Diagnosis not present

## 2021-05-23 DIAGNOSIS — M7542 Impingement syndrome of left shoulder: Secondary | ICD-10-CM | POA: Diagnosis not present

## 2021-05-23 DIAGNOSIS — M9902 Segmental and somatic dysfunction of thoracic region: Secondary | ICD-10-CM | POA: Diagnosis not present

## 2021-06-13 DIAGNOSIS — M9901 Segmental and somatic dysfunction of cervical region: Secondary | ICD-10-CM | POA: Diagnosis not present

## 2021-06-13 DIAGNOSIS — M9902 Segmental and somatic dysfunction of thoracic region: Secondary | ICD-10-CM | POA: Diagnosis not present

## 2021-06-13 DIAGNOSIS — M7542 Impingement syndrome of left shoulder: Secondary | ICD-10-CM | POA: Diagnosis not present

## 2021-06-13 DIAGNOSIS — M9907 Segmental and somatic dysfunction of upper extremity: Secondary | ICD-10-CM | POA: Diagnosis not present

## 2021-07-11 DIAGNOSIS — M7542 Impingement syndrome of left shoulder: Secondary | ICD-10-CM | POA: Diagnosis not present

## 2021-07-11 DIAGNOSIS — M9907 Segmental and somatic dysfunction of upper extremity: Secondary | ICD-10-CM | POA: Diagnosis not present

## 2021-07-11 DIAGNOSIS — M9901 Segmental and somatic dysfunction of cervical region: Secondary | ICD-10-CM | POA: Diagnosis not present

## 2021-07-11 DIAGNOSIS — M9902 Segmental and somatic dysfunction of thoracic region: Secondary | ICD-10-CM | POA: Diagnosis not present

## 2021-07-14 DIAGNOSIS — A041 Enterotoxigenic Escherichia coli infection: Secondary | ICD-10-CM | POA: Diagnosis not present

## 2021-07-21 DIAGNOSIS — R109 Unspecified abdominal pain: Secondary | ICD-10-CM | POA: Diagnosis not present

## 2021-07-21 DIAGNOSIS — A09 Infectious gastroenteritis and colitis, unspecified: Secondary | ICD-10-CM | POA: Diagnosis not present

## 2021-07-28 DIAGNOSIS — Z23 Encounter for immunization: Secondary | ICD-10-CM | POA: Diagnosis not present

## 2021-08-01 DIAGNOSIS — M7542 Impingement syndrome of left shoulder: Secondary | ICD-10-CM | POA: Diagnosis not present

## 2021-08-01 DIAGNOSIS — M9902 Segmental and somatic dysfunction of thoracic region: Secondary | ICD-10-CM | POA: Diagnosis not present

## 2021-08-01 DIAGNOSIS — M9907 Segmental and somatic dysfunction of upper extremity: Secondary | ICD-10-CM | POA: Diagnosis not present

## 2021-08-01 DIAGNOSIS — M9901 Segmental and somatic dysfunction of cervical region: Secondary | ICD-10-CM | POA: Diagnosis not present

## 2021-08-20 DIAGNOSIS — M9902 Segmental and somatic dysfunction of thoracic region: Secondary | ICD-10-CM | POA: Diagnosis not present

## 2021-08-20 DIAGNOSIS — M9907 Segmental and somatic dysfunction of upper extremity: Secondary | ICD-10-CM | POA: Diagnosis not present

## 2021-08-20 DIAGNOSIS — M9901 Segmental and somatic dysfunction of cervical region: Secondary | ICD-10-CM | POA: Diagnosis not present

## 2021-08-20 DIAGNOSIS — M7542 Impingement syndrome of left shoulder: Secondary | ICD-10-CM | POA: Diagnosis not present

## 2021-08-29 DIAGNOSIS — Z13220 Encounter for screening for lipoid disorders: Secondary | ICD-10-CM | POA: Diagnosis not present

## 2021-08-29 DIAGNOSIS — Z0000 Encounter for general adult medical examination without abnormal findings: Secondary | ICD-10-CM | POA: Diagnosis not present

## 2021-09-12 DIAGNOSIS — M9907 Segmental and somatic dysfunction of upper extremity: Secondary | ICD-10-CM | POA: Diagnosis not present

## 2021-09-12 DIAGNOSIS — M9901 Segmental and somatic dysfunction of cervical region: Secondary | ICD-10-CM | POA: Diagnosis not present

## 2021-09-12 DIAGNOSIS — M7542 Impingement syndrome of left shoulder: Secondary | ICD-10-CM | POA: Diagnosis not present

## 2021-09-12 DIAGNOSIS — M9902 Segmental and somatic dysfunction of thoracic region: Secondary | ICD-10-CM | POA: Diagnosis not present

## 2021-09-26 DIAGNOSIS — M9901 Segmental and somatic dysfunction of cervical region: Secondary | ICD-10-CM | POA: Diagnosis not present

## 2021-09-26 DIAGNOSIS — M7542 Impingement syndrome of left shoulder: Secondary | ICD-10-CM | POA: Diagnosis not present

## 2021-09-26 DIAGNOSIS — M9907 Segmental and somatic dysfunction of upper extremity: Secondary | ICD-10-CM | POA: Diagnosis not present

## 2021-09-26 DIAGNOSIS — M542 Cervicalgia: Secondary | ICD-10-CM | POA: Diagnosis not present

## 2021-09-26 DIAGNOSIS — M9902 Segmental and somatic dysfunction of thoracic region: Secondary | ICD-10-CM | POA: Diagnosis not present

## 2021-10-17 DIAGNOSIS — M9901 Segmental and somatic dysfunction of cervical region: Secondary | ICD-10-CM | POA: Diagnosis not present

## 2021-10-17 DIAGNOSIS — M7542 Impingement syndrome of left shoulder: Secondary | ICD-10-CM | POA: Diagnosis not present

## 2021-10-17 DIAGNOSIS — M9907 Segmental and somatic dysfunction of upper extremity: Secondary | ICD-10-CM | POA: Diagnosis not present

## 2021-10-17 DIAGNOSIS — M9902 Segmental and somatic dysfunction of thoracic region: Secondary | ICD-10-CM | POA: Diagnosis not present

## 2021-11-07 DIAGNOSIS — M7542 Impingement syndrome of left shoulder: Secondary | ICD-10-CM | POA: Diagnosis not present

## 2021-11-07 DIAGNOSIS — M9901 Segmental and somatic dysfunction of cervical region: Secondary | ICD-10-CM | POA: Diagnosis not present

## 2021-11-07 DIAGNOSIS — M9907 Segmental and somatic dysfunction of upper extremity: Secondary | ICD-10-CM | POA: Diagnosis not present

## 2021-11-07 DIAGNOSIS — M9902 Segmental and somatic dysfunction of thoracic region: Secondary | ICD-10-CM | POA: Diagnosis not present

## 2021-11-09 DIAGNOSIS — U071 COVID-19: Secondary | ICD-10-CM | POA: Diagnosis not present

## 2021-11-09 DIAGNOSIS — J029 Acute pharyngitis, unspecified: Secondary | ICD-10-CM | POA: Diagnosis not present

## 2021-11-09 DIAGNOSIS — R059 Cough, unspecified: Secondary | ICD-10-CM | POA: Diagnosis not present

## 2021-11-09 DIAGNOSIS — R0981 Nasal congestion: Secondary | ICD-10-CM | POA: Diagnosis not present

## 2021-11-09 DIAGNOSIS — R5383 Other fatigue: Secondary | ICD-10-CM | POA: Diagnosis not present

## 2021-11-09 DIAGNOSIS — R0982 Postnasal drip: Secondary | ICD-10-CM | POA: Diagnosis not present

## 2021-11-21 DIAGNOSIS — M7542 Impingement syndrome of left shoulder: Secondary | ICD-10-CM | POA: Diagnosis not present

## 2021-11-21 DIAGNOSIS — M542 Cervicalgia: Secondary | ICD-10-CM | POA: Diagnosis not present

## 2021-11-21 DIAGNOSIS — M9907 Segmental and somatic dysfunction of upper extremity: Secondary | ICD-10-CM | POA: Diagnosis not present

## 2021-11-21 DIAGNOSIS — M9902 Segmental and somatic dysfunction of thoracic region: Secondary | ICD-10-CM | POA: Diagnosis not present

## 2021-11-21 DIAGNOSIS — M9901 Segmental and somatic dysfunction of cervical region: Secondary | ICD-10-CM | POA: Diagnosis not present

## 2021-11-24 DIAGNOSIS — Z8616 Personal history of COVID-19: Secondary | ICD-10-CM | POA: Diagnosis not present

## 2021-11-24 DIAGNOSIS — R059 Cough, unspecified: Secondary | ICD-10-CM | POA: Diagnosis not present

## 2021-12-02 ENCOUNTER — Other Ambulatory Visit: Payer: Self-pay | Admitting: Family Medicine

## 2021-12-02 ENCOUNTER — Ambulatory Visit
Admission: RE | Admit: 2021-12-02 | Discharge: 2021-12-02 | Disposition: A | Discharge status: Home / Self Care | Payer: BC Managed Care – PPO | Source: Ambulatory Visit | Attending: Family Medicine | Admitting: Family Medicine

## 2021-12-02 DIAGNOSIS — R059 Cough, unspecified: Secondary | ICD-10-CM | POA: Diagnosis not present

## 2021-12-02 DIAGNOSIS — J189 Pneumonia, unspecified organism: Secondary | ICD-10-CM

## 2021-12-02 DIAGNOSIS — R0602 Shortness of breath: Secondary | ICD-10-CM | POA: Diagnosis not present

## 2021-12-02 DIAGNOSIS — U071 COVID-19: Secondary | ICD-10-CM | POA: Diagnosis not present

## 2021-12-10 DIAGNOSIS — M9902 Segmental and somatic dysfunction of thoracic region: Secondary | ICD-10-CM | POA: Diagnosis not present

## 2021-12-10 DIAGNOSIS — M542 Cervicalgia: Secondary | ICD-10-CM | POA: Diagnosis not present

## 2021-12-10 DIAGNOSIS — M9907 Segmental and somatic dysfunction of upper extremity: Secondary | ICD-10-CM | POA: Diagnosis not present

## 2021-12-10 DIAGNOSIS — M9901 Segmental and somatic dysfunction of cervical region: Secondary | ICD-10-CM | POA: Diagnosis not present

## 2021-12-10 DIAGNOSIS — M7542 Impingement syndrome of left shoulder: Secondary | ICD-10-CM | POA: Diagnosis not present

## 2021-12-17 DIAGNOSIS — L609 Nail disorder, unspecified: Secondary | ICD-10-CM | POA: Diagnosis not present

## 2021-12-17 DIAGNOSIS — L816 Other disorders of diminished melanin formation: Secondary | ICD-10-CM | POA: Diagnosis not present

## 2021-12-17 DIAGNOSIS — L905 Scar conditions and fibrosis of skin: Secondary | ICD-10-CM | POA: Diagnosis not present

## 2021-12-17 DIAGNOSIS — L218 Other seborrheic dermatitis: Secondary | ICD-10-CM | POA: Diagnosis not present

## 2021-12-24 DIAGNOSIS — M542 Cervicalgia: Secondary | ICD-10-CM | POA: Diagnosis not present

## 2021-12-24 DIAGNOSIS — M9901 Segmental and somatic dysfunction of cervical region: Secondary | ICD-10-CM | POA: Diagnosis not present

## 2021-12-24 DIAGNOSIS — M7542 Impingement syndrome of left shoulder: Secondary | ICD-10-CM | POA: Diagnosis not present

## 2021-12-24 DIAGNOSIS — M9907 Segmental and somatic dysfunction of upper extremity: Secondary | ICD-10-CM | POA: Diagnosis not present

## 2021-12-24 DIAGNOSIS — M9902 Segmental and somatic dysfunction of thoracic region: Secondary | ICD-10-CM | POA: Diagnosis not present

## 2022-01-07 DIAGNOSIS — M9901 Segmental and somatic dysfunction of cervical region: Secondary | ICD-10-CM | POA: Diagnosis not present

## 2022-01-07 DIAGNOSIS — M9902 Segmental and somatic dysfunction of thoracic region: Secondary | ICD-10-CM | POA: Diagnosis not present

## 2022-01-07 DIAGNOSIS — M7542 Impingement syndrome of left shoulder: Secondary | ICD-10-CM | POA: Diagnosis not present

## 2022-01-07 DIAGNOSIS — M9907 Segmental and somatic dysfunction of upper extremity: Secondary | ICD-10-CM | POA: Diagnosis not present

## 2022-01-09 DIAGNOSIS — M792 Neuralgia and neuritis, unspecified: Secondary | ICD-10-CM | POA: Diagnosis not present

## 2022-01-29 DIAGNOSIS — M9907 Segmental and somatic dysfunction of upper extremity: Secondary | ICD-10-CM | POA: Diagnosis not present

## 2022-01-29 DIAGNOSIS — M7542 Impingement syndrome of left shoulder: Secondary | ICD-10-CM | POA: Diagnosis not present

## 2022-01-29 DIAGNOSIS — M9902 Segmental and somatic dysfunction of thoracic region: Secondary | ICD-10-CM | POA: Diagnosis not present

## 2022-01-29 DIAGNOSIS — M9901 Segmental and somatic dysfunction of cervical region: Secondary | ICD-10-CM | POA: Diagnosis not present

## 2022-02-12 DIAGNOSIS — M9901 Segmental and somatic dysfunction of cervical region: Secondary | ICD-10-CM | POA: Diagnosis not present

## 2022-02-12 DIAGNOSIS — M9907 Segmental and somatic dysfunction of upper extremity: Secondary | ICD-10-CM | POA: Diagnosis not present

## 2022-02-12 DIAGNOSIS — M7542 Impingement syndrome of left shoulder: Secondary | ICD-10-CM | POA: Diagnosis not present

## 2022-02-12 DIAGNOSIS — M9902 Segmental and somatic dysfunction of thoracic region: Secondary | ICD-10-CM | POA: Diagnosis not present

## 2022-02-25 DIAGNOSIS — L728 Other follicular cysts of the skin and subcutaneous tissue: Secondary | ICD-10-CM | POA: Diagnosis not present

## 2022-02-25 DIAGNOSIS — B351 Tinea unguium: Secondary | ICD-10-CM | POA: Diagnosis not present

## 2022-02-25 DIAGNOSIS — L218 Other seborrheic dermatitis: Secondary | ICD-10-CM | POA: Diagnosis not present

## 2022-03-05 DIAGNOSIS — M9901 Segmental and somatic dysfunction of cervical region: Secondary | ICD-10-CM | POA: Diagnosis not present

## 2022-03-05 DIAGNOSIS — M9907 Segmental and somatic dysfunction of upper extremity: Secondary | ICD-10-CM | POA: Diagnosis not present

## 2022-03-05 DIAGNOSIS — M9902 Segmental and somatic dysfunction of thoracic region: Secondary | ICD-10-CM | POA: Diagnosis not present

## 2022-03-05 DIAGNOSIS — M7542 Impingement syndrome of left shoulder: Secondary | ICD-10-CM | POA: Diagnosis not present

## 2022-03-19 DIAGNOSIS — M792 Neuralgia and neuritis, unspecified: Secondary | ICD-10-CM | POA: Diagnosis not present

## 2022-03-27 DIAGNOSIS — M7542 Impingement syndrome of left shoulder: Secondary | ICD-10-CM | POA: Diagnosis not present

## 2022-03-27 DIAGNOSIS — M9901 Segmental and somatic dysfunction of cervical region: Secondary | ICD-10-CM | POA: Diagnosis not present

## 2022-03-27 DIAGNOSIS — M9907 Segmental and somatic dysfunction of upper extremity: Secondary | ICD-10-CM | POA: Diagnosis not present

## 2022-03-27 DIAGNOSIS — M9902 Segmental and somatic dysfunction of thoracic region: Secondary | ICD-10-CM | POA: Diagnosis not present

## 2022-04-08 DIAGNOSIS — M9901 Segmental and somatic dysfunction of cervical region: Secondary | ICD-10-CM | POA: Diagnosis not present

## 2022-04-08 DIAGNOSIS — M9907 Segmental and somatic dysfunction of upper extremity: Secondary | ICD-10-CM | POA: Diagnosis not present

## 2022-04-08 DIAGNOSIS — M7542 Impingement syndrome of left shoulder: Secondary | ICD-10-CM | POA: Diagnosis not present

## 2022-04-08 DIAGNOSIS — M9902 Segmental and somatic dysfunction of thoracic region: Secondary | ICD-10-CM | POA: Diagnosis not present

## 2022-04-28 DIAGNOSIS — M9902 Segmental and somatic dysfunction of thoracic region: Secondary | ICD-10-CM | POA: Diagnosis not present

## 2022-04-28 DIAGNOSIS — M9907 Segmental and somatic dysfunction of upper extremity: Secondary | ICD-10-CM | POA: Diagnosis not present

## 2022-04-28 DIAGNOSIS — M9901 Segmental and somatic dysfunction of cervical region: Secondary | ICD-10-CM | POA: Diagnosis not present

## 2022-04-28 DIAGNOSIS — M7542 Impingement syndrome of left shoulder: Secondary | ICD-10-CM | POA: Diagnosis not present

## 2022-05-12 DIAGNOSIS — M9902 Segmental and somatic dysfunction of thoracic region: Secondary | ICD-10-CM | POA: Diagnosis not present

## 2022-05-12 DIAGNOSIS — M9907 Segmental and somatic dysfunction of upper extremity: Secondary | ICD-10-CM | POA: Diagnosis not present

## 2022-05-12 DIAGNOSIS — M9901 Segmental and somatic dysfunction of cervical region: Secondary | ICD-10-CM | POA: Diagnosis not present

## 2022-05-12 DIAGNOSIS — M7542 Impingement syndrome of left shoulder: Secondary | ICD-10-CM | POA: Diagnosis not present

## 2022-05-26 DIAGNOSIS — M9902 Segmental and somatic dysfunction of thoracic region: Secondary | ICD-10-CM | POA: Diagnosis not present

## 2022-05-26 DIAGNOSIS — M7542 Impingement syndrome of left shoulder: Secondary | ICD-10-CM | POA: Diagnosis not present

## 2022-05-26 DIAGNOSIS — M9901 Segmental and somatic dysfunction of cervical region: Secondary | ICD-10-CM | POA: Diagnosis not present

## 2022-05-26 DIAGNOSIS — M9907 Segmental and somatic dysfunction of upper extremity: Secondary | ICD-10-CM | POA: Diagnosis not present

## 2022-06-08 DIAGNOSIS — M9907 Segmental and somatic dysfunction of upper extremity: Secondary | ICD-10-CM | POA: Diagnosis not present

## 2022-06-08 DIAGNOSIS — M9902 Segmental and somatic dysfunction of thoracic region: Secondary | ICD-10-CM | POA: Diagnosis not present

## 2022-06-08 DIAGNOSIS — M9901 Segmental and somatic dysfunction of cervical region: Secondary | ICD-10-CM | POA: Diagnosis not present

## 2022-06-08 DIAGNOSIS — M7542 Impingement syndrome of left shoulder: Secondary | ICD-10-CM | POA: Diagnosis not present

## 2022-06-29 DIAGNOSIS — M9901 Segmental and somatic dysfunction of cervical region: Secondary | ICD-10-CM | POA: Diagnosis not present

## 2022-06-29 DIAGNOSIS — M9902 Segmental and somatic dysfunction of thoracic region: Secondary | ICD-10-CM | POA: Diagnosis not present

## 2022-06-29 DIAGNOSIS — M9907 Segmental and somatic dysfunction of upper extremity: Secondary | ICD-10-CM | POA: Diagnosis not present

## 2022-06-29 DIAGNOSIS — M7542 Impingement syndrome of left shoulder: Secondary | ICD-10-CM | POA: Diagnosis not present

## 2022-08-05 DIAGNOSIS — S61216A Laceration without foreign body of right little finger without damage to nail, initial encounter: Secondary | ICD-10-CM | POA: Diagnosis not present

## 2022-08-05 DIAGNOSIS — W260XXA Contact with knife, initial encounter: Secondary | ICD-10-CM | POA: Diagnosis not present

## 2022-08-07 DIAGNOSIS — M9907 Segmental and somatic dysfunction of upper extremity: Secondary | ICD-10-CM | POA: Diagnosis not present

## 2022-08-07 DIAGNOSIS — M9902 Segmental and somatic dysfunction of thoracic region: Secondary | ICD-10-CM | POA: Diagnosis not present

## 2022-08-07 DIAGNOSIS — M7542 Impingement syndrome of left shoulder: Secondary | ICD-10-CM | POA: Diagnosis not present

## 2022-08-07 DIAGNOSIS — M9901 Segmental and somatic dysfunction of cervical region: Secondary | ICD-10-CM | POA: Diagnosis not present

## 2022-08-12 DIAGNOSIS — Z4802 Encounter for removal of sutures: Secondary | ICD-10-CM | POA: Diagnosis not present

## 2022-08-14 DIAGNOSIS — M9902 Segmental and somatic dysfunction of thoracic region: Secondary | ICD-10-CM | POA: Diagnosis not present

## 2022-08-14 DIAGNOSIS — M9901 Segmental and somatic dysfunction of cervical region: Secondary | ICD-10-CM | POA: Diagnosis not present

## 2022-08-14 DIAGNOSIS — M7542 Impingement syndrome of left shoulder: Secondary | ICD-10-CM | POA: Diagnosis not present

## 2022-08-14 DIAGNOSIS — M9907 Segmental and somatic dysfunction of upper extremity: Secondary | ICD-10-CM | POA: Diagnosis not present

## 2022-08-28 DIAGNOSIS — M9902 Segmental and somatic dysfunction of thoracic region: Secondary | ICD-10-CM | POA: Diagnosis not present

## 2022-08-28 DIAGNOSIS — M9907 Segmental and somatic dysfunction of upper extremity: Secondary | ICD-10-CM | POA: Diagnosis not present

## 2022-08-28 DIAGNOSIS — M9901 Segmental and somatic dysfunction of cervical region: Secondary | ICD-10-CM | POA: Diagnosis not present

## 2022-08-28 DIAGNOSIS — M7542 Impingement syndrome of left shoulder: Secondary | ICD-10-CM | POA: Diagnosis not present

## 2022-09-11 DIAGNOSIS — M9902 Segmental and somatic dysfunction of thoracic region: Secondary | ICD-10-CM | POA: Diagnosis not present

## 2022-09-11 DIAGNOSIS — M9907 Segmental and somatic dysfunction of upper extremity: Secondary | ICD-10-CM | POA: Diagnosis not present

## 2022-09-11 DIAGNOSIS — M7542 Impingement syndrome of left shoulder: Secondary | ICD-10-CM | POA: Diagnosis not present

## 2022-09-11 DIAGNOSIS — M9901 Segmental and somatic dysfunction of cervical region: Secondary | ICD-10-CM | POA: Diagnosis not present

## 2022-09-16 DIAGNOSIS — Z0000 Encounter for general adult medical examination without abnormal findings: Secondary | ICD-10-CM | POA: Diagnosis not present

## 2022-09-17 DIAGNOSIS — R92323 Mammographic fibroglandular density, bilateral breasts: Secondary | ICD-10-CM | POA: Diagnosis not present

## 2022-09-17 DIAGNOSIS — Z1231 Encounter for screening mammogram for malignant neoplasm of breast: Secondary | ICD-10-CM | POA: Diagnosis not present

## 2022-09-24 DIAGNOSIS — M9901 Segmental and somatic dysfunction of cervical region: Secondary | ICD-10-CM | POA: Diagnosis not present

## 2022-09-24 DIAGNOSIS — M7542 Impingement syndrome of left shoulder: Secondary | ICD-10-CM | POA: Diagnosis not present

## 2022-09-24 DIAGNOSIS — M9907 Segmental and somatic dysfunction of upper extremity: Secondary | ICD-10-CM | POA: Diagnosis not present

## 2022-09-24 DIAGNOSIS — M9902 Segmental and somatic dysfunction of thoracic region: Secondary | ICD-10-CM | POA: Diagnosis not present

## 2022-09-30 DIAGNOSIS — M9907 Segmental and somatic dysfunction of upper extremity: Secondary | ICD-10-CM | POA: Diagnosis not present

## 2022-09-30 DIAGNOSIS — M9901 Segmental and somatic dysfunction of cervical region: Secondary | ICD-10-CM | POA: Diagnosis not present

## 2022-09-30 DIAGNOSIS — M7542 Impingement syndrome of left shoulder: Secondary | ICD-10-CM | POA: Diagnosis not present

## 2022-09-30 DIAGNOSIS — M9902 Segmental and somatic dysfunction of thoracic region: Secondary | ICD-10-CM | POA: Diagnosis not present

## 2022-10-07 DIAGNOSIS — N6001 Solitary cyst of right breast: Secondary | ICD-10-CM | POA: Diagnosis not present

## 2022-10-07 DIAGNOSIS — Z0000 Encounter for general adult medical examination without abnormal findings: Secondary | ICD-10-CM | POA: Diagnosis not present

## 2022-10-07 DIAGNOSIS — Z13220 Encounter for screening for lipoid disorders: Secondary | ICD-10-CM | POA: Diagnosis not present

## 2022-10-07 DIAGNOSIS — R739 Hyperglycemia, unspecified: Secondary | ICD-10-CM | POA: Diagnosis not present

## 2022-10-08 DIAGNOSIS — M9901 Segmental and somatic dysfunction of cervical region: Secondary | ICD-10-CM | POA: Diagnosis not present

## 2022-10-08 DIAGNOSIS — M9902 Segmental and somatic dysfunction of thoracic region: Secondary | ICD-10-CM | POA: Diagnosis not present

## 2022-10-08 DIAGNOSIS — M7542 Impingement syndrome of left shoulder: Secondary | ICD-10-CM | POA: Diagnosis not present

## 2022-10-08 DIAGNOSIS — M9907 Segmental and somatic dysfunction of upper extremity: Secondary | ICD-10-CM | POA: Diagnosis not present

## 2022-10-22 DIAGNOSIS — M9902 Segmental and somatic dysfunction of thoracic region: Secondary | ICD-10-CM | POA: Diagnosis not present

## 2022-10-22 DIAGNOSIS — M9907 Segmental and somatic dysfunction of upper extremity: Secondary | ICD-10-CM | POA: Diagnosis not present

## 2022-10-22 DIAGNOSIS — M9901 Segmental and somatic dysfunction of cervical region: Secondary | ICD-10-CM | POA: Diagnosis not present

## 2022-10-22 DIAGNOSIS — M7542 Impingement syndrome of left shoulder: Secondary | ICD-10-CM | POA: Diagnosis not present

## 2022-11-05 DIAGNOSIS — M9901 Segmental and somatic dysfunction of cervical region: Secondary | ICD-10-CM | POA: Diagnosis not present

## 2022-11-05 DIAGNOSIS — M9902 Segmental and somatic dysfunction of thoracic region: Secondary | ICD-10-CM | POA: Diagnosis not present

## 2022-11-05 DIAGNOSIS — M7542 Impingement syndrome of left shoulder: Secondary | ICD-10-CM | POA: Diagnosis not present

## 2022-11-05 DIAGNOSIS — M9907 Segmental and somatic dysfunction of upper extremity: Secondary | ICD-10-CM | POA: Diagnosis not present

## 2022-11-19 DIAGNOSIS — M9907 Segmental and somatic dysfunction of upper extremity: Secondary | ICD-10-CM | POA: Diagnosis not present

## 2022-11-19 DIAGNOSIS — M9902 Segmental and somatic dysfunction of thoracic region: Secondary | ICD-10-CM | POA: Diagnosis not present

## 2022-11-19 DIAGNOSIS — M9901 Segmental and somatic dysfunction of cervical region: Secondary | ICD-10-CM | POA: Diagnosis not present

## 2022-11-19 DIAGNOSIS — M9903 Segmental and somatic dysfunction of lumbar region: Secondary | ICD-10-CM | POA: Diagnosis not present

## 2022-11-19 DIAGNOSIS — M542 Cervicalgia: Secondary | ICD-10-CM | POA: Diagnosis not present

## 2022-12-03 DIAGNOSIS — M9901 Segmental and somatic dysfunction of cervical region: Secondary | ICD-10-CM | POA: Diagnosis not present

## 2022-12-03 DIAGNOSIS — M9902 Segmental and somatic dysfunction of thoracic region: Secondary | ICD-10-CM | POA: Diagnosis not present

## 2022-12-03 DIAGNOSIS — M9907 Segmental and somatic dysfunction of upper extremity: Secondary | ICD-10-CM | POA: Diagnosis not present

## 2022-12-03 DIAGNOSIS — M542 Cervicalgia: Secondary | ICD-10-CM | POA: Diagnosis not present

## 2022-12-03 DIAGNOSIS — M9903 Segmental and somatic dysfunction of lumbar region: Secondary | ICD-10-CM | POA: Diagnosis not present

## 2022-12-16 DIAGNOSIS — M9902 Segmental and somatic dysfunction of thoracic region: Secondary | ICD-10-CM | POA: Diagnosis not present

## 2022-12-16 DIAGNOSIS — M542 Cervicalgia: Secondary | ICD-10-CM | POA: Diagnosis not present

## 2022-12-16 DIAGNOSIS — M9901 Segmental and somatic dysfunction of cervical region: Secondary | ICD-10-CM | POA: Diagnosis not present

## 2022-12-16 DIAGNOSIS — M9907 Segmental and somatic dysfunction of upper extremity: Secondary | ICD-10-CM | POA: Diagnosis not present

## 2022-12-16 DIAGNOSIS — M9903 Segmental and somatic dysfunction of lumbar region: Secondary | ICD-10-CM | POA: Diagnosis not present

## 2022-12-22 DIAGNOSIS — L218 Other seborrheic dermatitis: Secondary | ICD-10-CM | POA: Diagnosis not present

## 2022-12-22 DIAGNOSIS — B351 Tinea unguium: Secondary | ICD-10-CM | POA: Diagnosis not present

## 2022-12-22 DIAGNOSIS — L280 Lichen simplex chronicus: Secondary | ICD-10-CM | POA: Diagnosis not present

## 2023-01-04 DIAGNOSIS — M542 Cervicalgia: Secondary | ICD-10-CM | POA: Diagnosis not present

## 2023-01-04 DIAGNOSIS — M9902 Segmental and somatic dysfunction of thoracic region: Secondary | ICD-10-CM | POA: Diagnosis not present

## 2023-01-04 DIAGNOSIS — M9907 Segmental and somatic dysfunction of upper extremity: Secondary | ICD-10-CM | POA: Diagnosis not present

## 2023-01-04 DIAGNOSIS — M9903 Segmental and somatic dysfunction of lumbar region: Secondary | ICD-10-CM | POA: Diagnosis not present

## 2023-01-04 DIAGNOSIS — M9901 Segmental and somatic dysfunction of cervical region: Secondary | ICD-10-CM | POA: Diagnosis not present

## 2023-01-11 DIAGNOSIS — M9902 Segmental and somatic dysfunction of thoracic region: Secondary | ICD-10-CM | POA: Diagnosis not present

## 2023-01-11 DIAGNOSIS — M9903 Segmental and somatic dysfunction of lumbar region: Secondary | ICD-10-CM | POA: Diagnosis not present

## 2023-01-11 DIAGNOSIS — M542 Cervicalgia: Secondary | ICD-10-CM | POA: Diagnosis not present

## 2023-01-11 DIAGNOSIS — M9907 Segmental and somatic dysfunction of upper extremity: Secondary | ICD-10-CM | POA: Diagnosis not present

## 2023-01-11 DIAGNOSIS — M9901 Segmental and somatic dysfunction of cervical region: Secondary | ICD-10-CM | POA: Diagnosis not present

## 2023-01-18 DIAGNOSIS — M9901 Segmental and somatic dysfunction of cervical region: Secondary | ICD-10-CM | POA: Diagnosis not present

## 2023-01-18 DIAGNOSIS — M9903 Segmental and somatic dysfunction of lumbar region: Secondary | ICD-10-CM | POA: Diagnosis not present

## 2023-01-18 DIAGNOSIS — M542 Cervicalgia: Secondary | ICD-10-CM | POA: Diagnosis not present

## 2023-01-18 DIAGNOSIS — M9902 Segmental and somatic dysfunction of thoracic region: Secondary | ICD-10-CM | POA: Diagnosis not present

## 2023-01-18 DIAGNOSIS — M9907 Segmental and somatic dysfunction of upper extremity: Secondary | ICD-10-CM | POA: Diagnosis not present

## 2023-01-25 DIAGNOSIS — M542 Cervicalgia: Secondary | ICD-10-CM | POA: Diagnosis not present

## 2023-01-25 DIAGNOSIS — M9903 Segmental and somatic dysfunction of lumbar region: Secondary | ICD-10-CM | POA: Diagnosis not present

## 2023-01-25 DIAGNOSIS — M9901 Segmental and somatic dysfunction of cervical region: Secondary | ICD-10-CM | POA: Diagnosis not present

## 2023-01-25 DIAGNOSIS — M9902 Segmental and somatic dysfunction of thoracic region: Secondary | ICD-10-CM | POA: Diagnosis not present

## 2023-01-25 DIAGNOSIS — M9907 Segmental and somatic dysfunction of upper extremity: Secondary | ICD-10-CM | POA: Diagnosis not present

## 2023-01-31 DIAGNOSIS — J029 Acute pharyngitis, unspecified: Secondary | ICD-10-CM | POA: Diagnosis not present

## 2023-01-31 DIAGNOSIS — J398 Other specified diseases of upper respiratory tract: Secondary | ICD-10-CM | POA: Diagnosis not present

## 2023-01-31 DIAGNOSIS — Z20822 Contact with and (suspected) exposure to covid-19: Secondary | ICD-10-CM | POA: Diagnosis not present

## 2023-02-03 DIAGNOSIS — J069 Acute upper respiratory infection, unspecified: Secondary | ICD-10-CM | POA: Diagnosis not present

## 2023-02-09 DIAGNOSIS — R051 Acute cough: Secondary | ICD-10-CM | POA: Diagnosis not present

## 2023-02-15 DIAGNOSIS — J40 Bronchitis, not specified as acute or chronic: Secondary | ICD-10-CM | POA: Diagnosis not present

## 2023-02-15 DIAGNOSIS — R9431 Abnormal electrocardiogram [ECG] [EKG]: Secondary | ICD-10-CM | POA: Diagnosis not present

## 2023-02-18 DIAGNOSIS — J209 Acute bronchitis, unspecified: Secondary | ICD-10-CM | POA: Diagnosis not present

## 2023-02-18 DIAGNOSIS — J029 Acute pharyngitis, unspecified: Secondary | ICD-10-CM | POA: Diagnosis not present

## 2023-02-18 DIAGNOSIS — R052 Subacute cough: Secondary | ICD-10-CM | POA: Diagnosis not present

## 2023-03-05 DIAGNOSIS — M9902 Segmental and somatic dysfunction of thoracic region: Secondary | ICD-10-CM | POA: Diagnosis not present

## 2023-03-05 DIAGNOSIS — M542 Cervicalgia: Secondary | ICD-10-CM | POA: Diagnosis not present

## 2023-03-05 DIAGNOSIS — M9901 Segmental and somatic dysfunction of cervical region: Secondary | ICD-10-CM | POA: Diagnosis not present

## 2023-03-05 DIAGNOSIS — M9903 Segmental and somatic dysfunction of lumbar region: Secondary | ICD-10-CM | POA: Diagnosis not present

## 2023-03-05 DIAGNOSIS — M9907 Segmental and somatic dysfunction of upper extremity: Secondary | ICD-10-CM | POA: Diagnosis not present

## 2023-03-12 DIAGNOSIS — M9903 Segmental and somatic dysfunction of lumbar region: Secondary | ICD-10-CM | POA: Diagnosis not present

## 2023-03-12 DIAGNOSIS — M9902 Segmental and somatic dysfunction of thoracic region: Secondary | ICD-10-CM | POA: Diagnosis not present

## 2023-03-12 DIAGNOSIS — M9901 Segmental and somatic dysfunction of cervical region: Secondary | ICD-10-CM | POA: Diagnosis not present

## 2023-03-12 DIAGNOSIS — M542 Cervicalgia: Secondary | ICD-10-CM | POA: Diagnosis not present

## 2023-03-12 DIAGNOSIS — M9907 Segmental and somatic dysfunction of upper extremity: Secondary | ICD-10-CM | POA: Diagnosis not present

## 2023-03-21 DIAGNOSIS — R9431 Abnormal electrocardiogram [ECG] [EKG]: Secondary | ICD-10-CM | POA: Diagnosis not present

## 2023-03-22 DIAGNOSIS — M542 Cervicalgia: Secondary | ICD-10-CM | POA: Diagnosis not present

## 2023-03-22 DIAGNOSIS — M9902 Segmental and somatic dysfunction of thoracic region: Secondary | ICD-10-CM | POA: Diagnosis not present

## 2023-03-22 DIAGNOSIS — M9907 Segmental and somatic dysfunction of upper extremity: Secondary | ICD-10-CM | POA: Diagnosis not present

## 2023-03-22 DIAGNOSIS — M9903 Segmental and somatic dysfunction of lumbar region: Secondary | ICD-10-CM | POA: Diagnosis not present

## 2023-03-22 DIAGNOSIS — M9901 Segmental and somatic dysfunction of cervical region: Secondary | ICD-10-CM | POA: Diagnosis not present

## 2023-03-31 DIAGNOSIS — M542 Cervicalgia: Secondary | ICD-10-CM | POA: Diagnosis not present

## 2023-03-31 DIAGNOSIS — M9907 Segmental and somatic dysfunction of upper extremity: Secondary | ICD-10-CM | POA: Diagnosis not present

## 2023-03-31 DIAGNOSIS — M9901 Segmental and somatic dysfunction of cervical region: Secondary | ICD-10-CM | POA: Diagnosis not present

## 2023-03-31 DIAGNOSIS — M9902 Segmental and somatic dysfunction of thoracic region: Secondary | ICD-10-CM | POA: Diagnosis not present

## 2023-03-31 DIAGNOSIS — M9903 Segmental and somatic dysfunction of lumbar region: Secondary | ICD-10-CM | POA: Diagnosis not present

## 2023-04-09 DIAGNOSIS — R928 Other abnormal and inconclusive findings on diagnostic imaging of breast: Secondary | ICD-10-CM | POA: Diagnosis not present

## 2023-04-28 DIAGNOSIS — L74510 Primary focal hyperhidrosis, axilla: Secondary | ICD-10-CM | POA: Diagnosis not present

## 2023-04-28 DIAGNOSIS — L74519 Primary focal hyperhidrosis, unspecified: Secondary | ICD-10-CM | POA: Diagnosis not present

## 2023-05-06 DIAGNOSIS — M9901 Segmental and somatic dysfunction of cervical region: Secondary | ICD-10-CM | POA: Diagnosis not present

## 2023-05-06 DIAGNOSIS — M9902 Segmental and somatic dysfunction of thoracic region: Secondary | ICD-10-CM | POA: Diagnosis not present

## 2023-05-06 DIAGNOSIS — M9903 Segmental and somatic dysfunction of lumbar region: Secondary | ICD-10-CM | POA: Diagnosis not present

## 2023-05-06 DIAGNOSIS — M9907 Segmental and somatic dysfunction of upper extremity: Secondary | ICD-10-CM | POA: Diagnosis not present

## 2023-05-06 DIAGNOSIS — M542 Cervicalgia: Secondary | ICD-10-CM | POA: Diagnosis not present

## 2023-05-13 DIAGNOSIS — M9903 Segmental and somatic dysfunction of lumbar region: Secondary | ICD-10-CM | POA: Diagnosis not present

## 2023-05-13 DIAGNOSIS — M9907 Segmental and somatic dysfunction of upper extremity: Secondary | ICD-10-CM | POA: Diagnosis not present

## 2023-05-13 DIAGNOSIS — M9901 Segmental and somatic dysfunction of cervical region: Secondary | ICD-10-CM | POA: Diagnosis not present

## 2023-05-13 DIAGNOSIS — M9902 Segmental and somatic dysfunction of thoracic region: Secondary | ICD-10-CM | POA: Diagnosis not present

## 2023-05-13 DIAGNOSIS — M542 Cervicalgia: Secondary | ICD-10-CM | POA: Diagnosis not present

## 2023-05-25 DIAGNOSIS — L219 Seborrheic dermatitis, unspecified: Secondary | ICD-10-CM | POA: Insufficient documentation

## 2023-05-25 DIAGNOSIS — K648 Other hemorrhoids: Secondary | ICD-10-CM | POA: Insufficient documentation

## 2023-05-25 DIAGNOSIS — U071 COVID-19: Secondary | ICD-10-CM | POA: Diagnosis not present

## 2023-05-25 DIAGNOSIS — R011 Cardiac murmur, unspecified: Secondary | ICD-10-CM | POA: Insufficient documentation

## 2023-05-25 DIAGNOSIS — G43909 Migraine, unspecified, not intractable, without status migrainosus: Secondary | ICD-10-CM | POA: Insufficient documentation

## 2023-05-25 DIAGNOSIS — R0989 Other specified symptoms and signs involving the circulatory and respiratory systems: Secondary | ICD-10-CM | POA: Diagnosis not present

## 2023-05-25 DIAGNOSIS — M1990 Unspecified osteoarthritis, unspecified site: Secondary | ICD-10-CM | POA: Insufficient documentation

## 2023-06-04 DIAGNOSIS — M9901 Segmental and somatic dysfunction of cervical region: Secondary | ICD-10-CM | POA: Diagnosis not present

## 2023-06-04 DIAGNOSIS — R0989 Other specified symptoms and signs involving the circulatory and respiratory systems: Secondary | ICD-10-CM | POA: Diagnosis not present

## 2023-06-04 DIAGNOSIS — M542 Cervicalgia: Secondary | ICD-10-CM | POA: Diagnosis not present

## 2023-06-04 DIAGNOSIS — M9907 Segmental and somatic dysfunction of upper extremity: Secondary | ICD-10-CM | POA: Diagnosis not present

## 2023-06-04 DIAGNOSIS — M9903 Segmental and somatic dysfunction of lumbar region: Secondary | ICD-10-CM | POA: Diagnosis not present

## 2023-06-04 DIAGNOSIS — M9902 Segmental and somatic dysfunction of thoracic region: Secondary | ICD-10-CM | POA: Diagnosis not present

## 2023-06-10 DIAGNOSIS — M9901 Segmental and somatic dysfunction of cervical region: Secondary | ICD-10-CM | POA: Diagnosis not present

## 2023-06-10 DIAGNOSIS — M9903 Segmental and somatic dysfunction of lumbar region: Secondary | ICD-10-CM | POA: Diagnosis not present

## 2023-06-10 DIAGNOSIS — M9907 Segmental and somatic dysfunction of upper extremity: Secondary | ICD-10-CM | POA: Diagnosis not present

## 2023-06-10 DIAGNOSIS — M9902 Segmental and somatic dysfunction of thoracic region: Secondary | ICD-10-CM | POA: Diagnosis not present

## 2023-06-10 DIAGNOSIS — M542 Cervicalgia: Secondary | ICD-10-CM | POA: Diagnosis not present

## 2023-06-18 IMAGING — CR DG CHEST 2V
3 series · 3 of 3 positions shown · non-contrast
Comparison: No prior.

CLINICAL DATA: Cough.  Shortness of breath.  Congestion.

EXAM:
CHEST - 2 VIEW

[w chest pa]
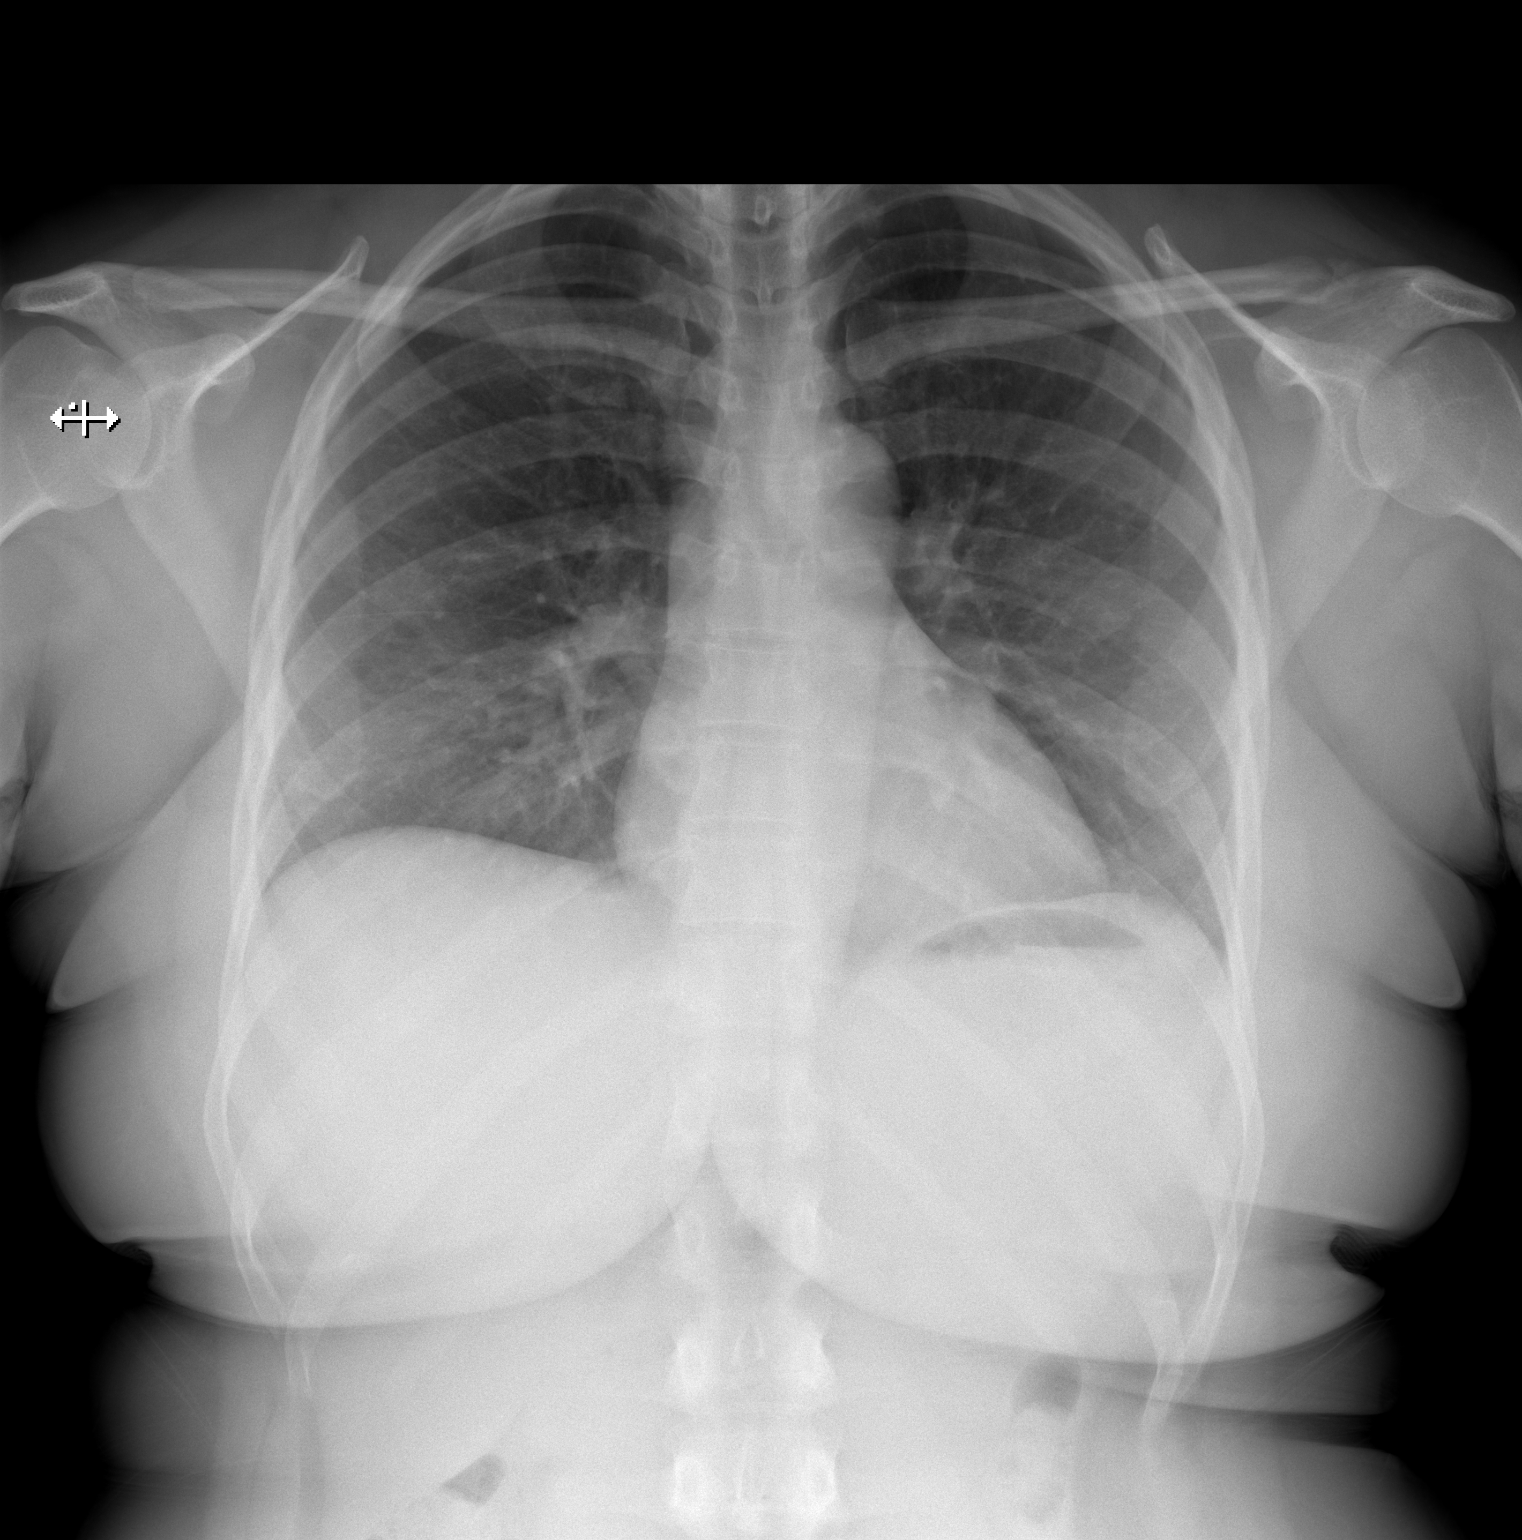

[w chest lat (1 of 2)]
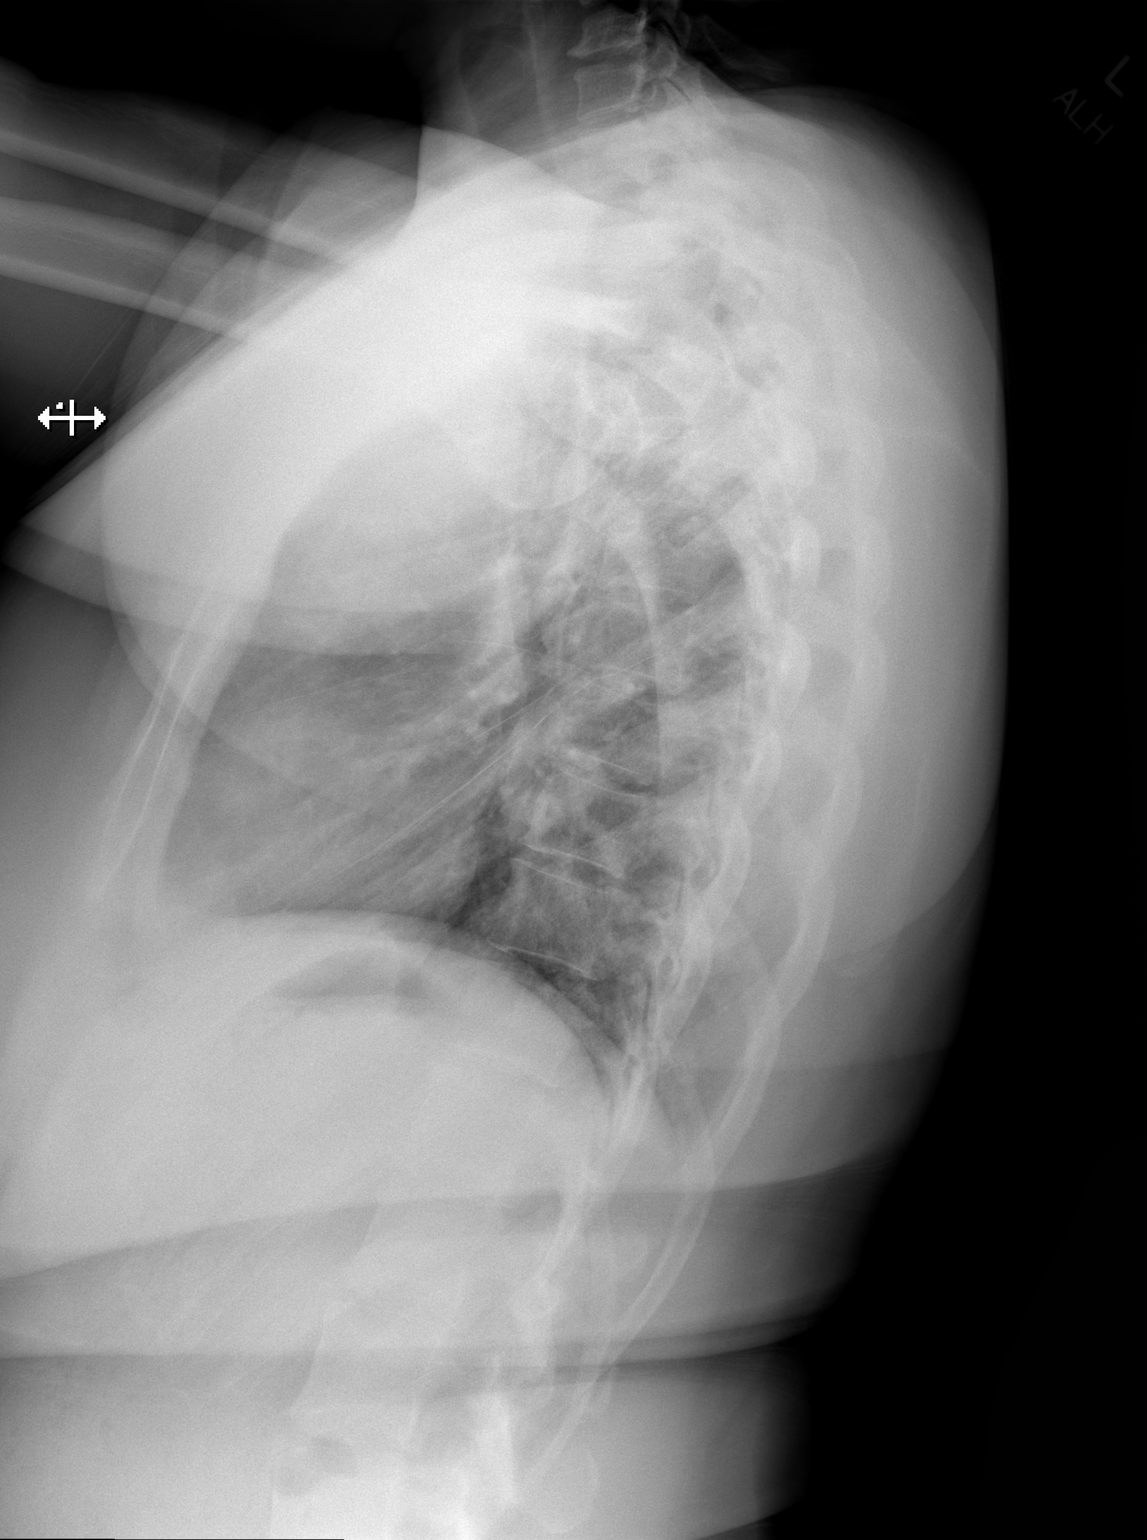

[w chest lat (2 of 2)]
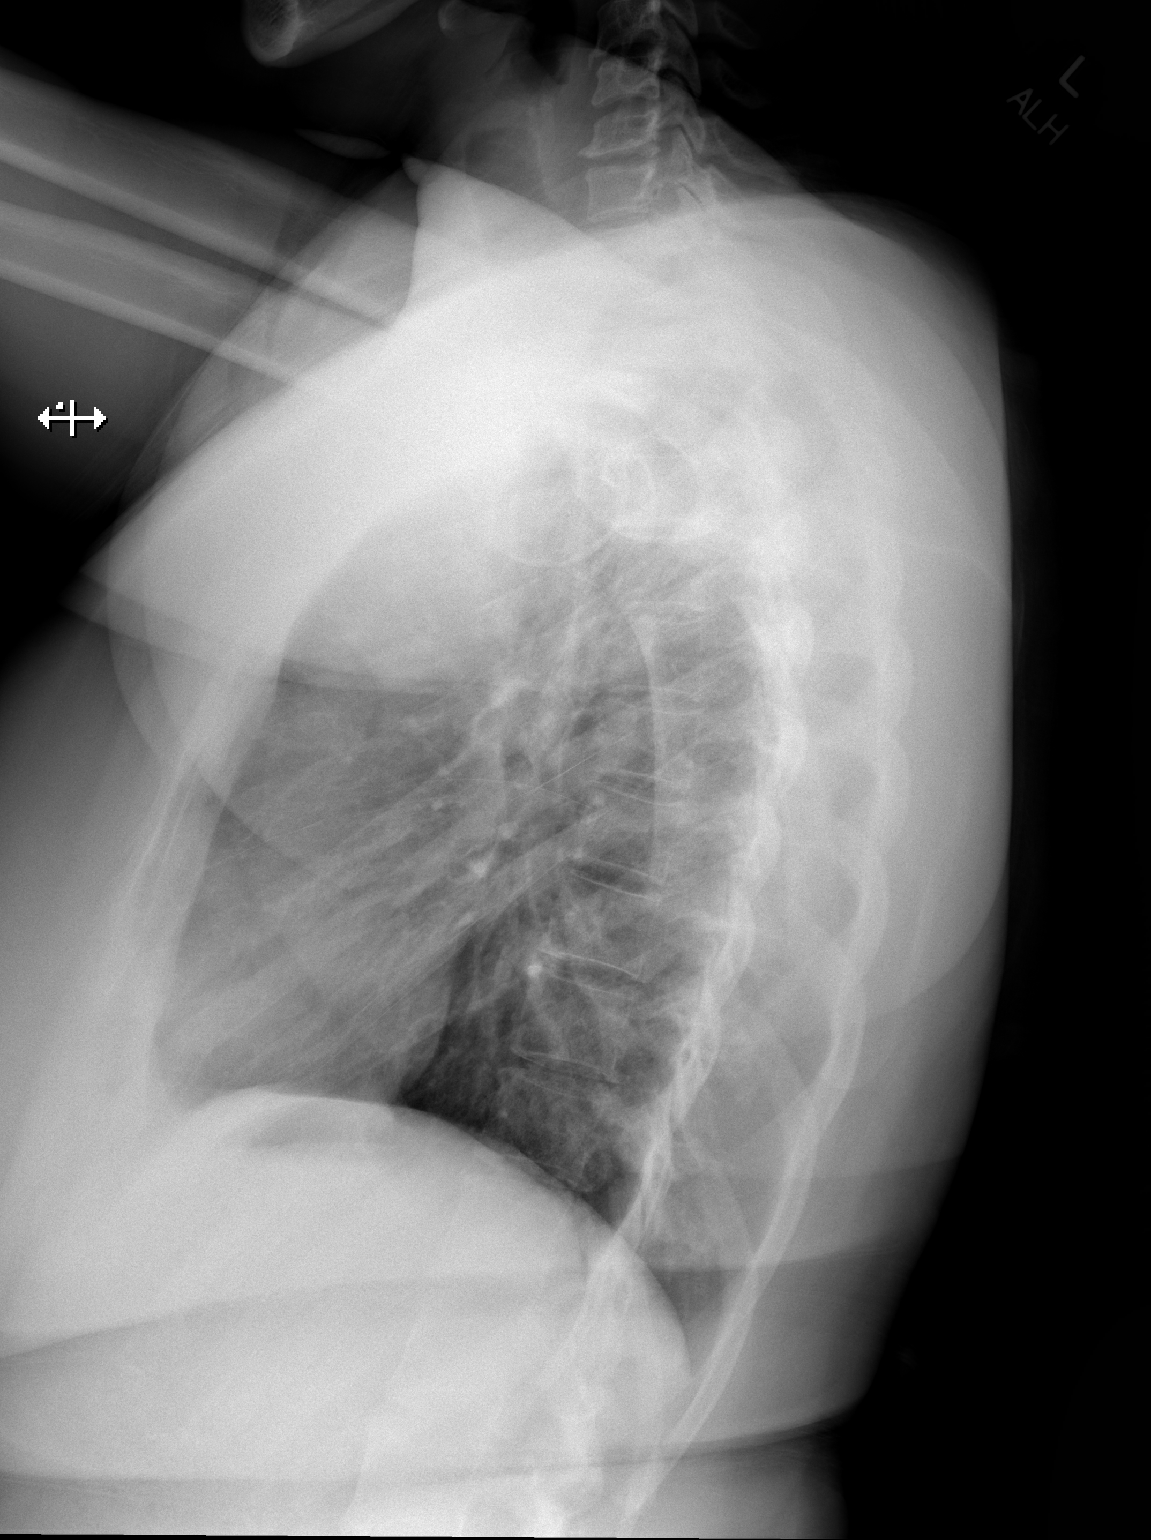

[3 of 3 positions shown; findings below may reference images not displayed]

FINDINGS: Mediastinum hilar structures normal. Heart size normal. Low lung
volumes. No focal infiltrate. No pleural effusion or pneumothorax.
Mild scoliosis thoracic spine. No acute bony abnormality identified.
IMPRESSION: Low lung volumes.  No acute cardiopulmonary disease.

## 2023-06-25 DIAGNOSIS — M9903 Segmental and somatic dysfunction of lumbar region: Secondary | ICD-10-CM | POA: Diagnosis not present

## 2023-06-25 DIAGNOSIS — M9907 Segmental and somatic dysfunction of upper extremity: Secondary | ICD-10-CM | POA: Diagnosis not present

## 2023-06-25 DIAGNOSIS — M9902 Segmental and somatic dysfunction of thoracic region: Secondary | ICD-10-CM | POA: Diagnosis not present

## 2023-06-25 DIAGNOSIS — M542 Cervicalgia: Secondary | ICD-10-CM | POA: Diagnosis not present

## 2023-06-25 DIAGNOSIS — M9901 Segmental and somatic dysfunction of cervical region: Secondary | ICD-10-CM | POA: Diagnosis not present

## 2023-07-09 DIAGNOSIS — M9903 Segmental and somatic dysfunction of lumbar region: Secondary | ICD-10-CM | POA: Diagnosis not present

## 2023-07-09 DIAGNOSIS — M542 Cervicalgia: Secondary | ICD-10-CM | POA: Diagnosis not present

## 2023-07-09 DIAGNOSIS — M9901 Segmental and somatic dysfunction of cervical region: Secondary | ICD-10-CM | POA: Diagnosis not present

## 2023-07-09 DIAGNOSIS — M9902 Segmental and somatic dysfunction of thoracic region: Secondary | ICD-10-CM | POA: Diagnosis not present

## 2023-07-09 DIAGNOSIS — M9907 Segmental and somatic dysfunction of upper extremity: Secondary | ICD-10-CM | POA: Diagnosis not present

## 2023-07-23 DIAGNOSIS — M9903 Segmental and somatic dysfunction of lumbar region: Secondary | ICD-10-CM | POA: Diagnosis not present

## 2023-07-23 DIAGNOSIS — M9902 Segmental and somatic dysfunction of thoracic region: Secondary | ICD-10-CM | POA: Diagnosis not present

## 2023-07-23 DIAGNOSIS — M9901 Segmental and somatic dysfunction of cervical region: Secondary | ICD-10-CM | POA: Diagnosis not present

## 2023-07-23 DIAGNOSIS — M9907 Segmental and somatic dysfunction of upper extremity: Secondary | ICD-10-CM | POA: Diagnosis not present

## 2023-07-23 DIAGNOSIS — M542 Cervicalgia: Secondary | ICD-10-CM | POA: Diagnosis not present

## 2023-08-04 DIAGNOSIS — M9902 Segmental and somatic dysfunction of thoracic region: Secondary | ICD-10-CM | POA: Diagnosis not present

## 2023-08-04 DIAGNOSIS — M9903 Segmental and somatic dysfunction of lumbar region: Secondary | ICD-10-CM | POA: Diagnosis not present

## 2023-08-04 DIAGNOSIS — M542 Cervicalgia: Secondary | ICD-10-CM | POA: Diagnosis not present

## 2023-08-04 DIAGNOSIS — M9907 Segmental and somatic dysfunction of upper extremity: Secondary | ICD-10-CM | POA: Diagnosis not present

## 2023-08-04 DIAGNOSIS — M9901 Segmental and somatic dysfunction of cervical region: Secondary | ICD-10-CM | POA: Diagnosis not present

## 2023-08-05 DIAGNOSIS — J40 Bronchitis, not specified as acute or chronic: Secondary | ICD-10-CM | POA: Diagnosis not present

## 2023-08-20 DIAGNOSIS — R058 Other specified cough: Secondary | ICD-10-CM | POA: Diagnosis not present

## 2023-09-07 DIAGNOSIS — G479 Sleep disorder, unspecified: Secondary | ICD-10-CM | POA: Diagnosis not present

## 2023-09-07 DIAGNOSIS — R002 Palpitations: Secondary | ICD-10-CM | POA: Diagnosis not present

## 2023-09-07 DIAGNOSIS — F419 Anxiety disorder, unspecified: Secondary | ICD-10-CM | POA: Diagnosis not present

## 2023-09-15 ENCOUNTER — Ambulatory Visit: Payer: BC Managed Care – PPO | Attending: Physician Assistant

## 2023-09-15 ENCOUNTER — Other Ambulatory Visit: Payer: Self-pay | Admitting: Radiology

## 2023-09-15 DIAGNOSIS — R002 Palpitations: Secondary | ICD-10-CM

## 2023-09-15 NOTE — Progress Notes (Unsigned)
EP to read

## 2023-09-18 DIAGNOSIS — R002 Palpitations: Secondary | ICD-10-CM

## 2023-09-20 DIAGNOSIS — Z0000 Encounter for general adult medical examination without abnormal findings: Secondary | ICD-10-CM | POA: Diagnosis not present

## 2023-09-20 DIAGNOSIS — R002 Palpitations: Secondary | ICD-10-CM | POA: Diagnosis not present

## 2023-09-20 DIAGNOSIS — Z13220 Encounter for screening for lipoid disorders: Secondary | ICD-10-CM | POA: Diagnosis not present

## 2023-10-09 DIAGNOSIS — R000 Tachycardia, unspecified: Secondary | ICD-10-CM | POA: Diagnosis not present

## 2023-10-09 DIAGNOSIS — R002 Palpitations: Secondary | ICD-10-CM | POA: Diagnosis not present

## 2023-10-09 DIAGNOSIS — R079 Chest pain, unspecified: Secondary | ICD-10-CM | POA: Diagnosis not present

## 2023-10-09 DIAGNOSIS — R0602 Shortness of breath: Secondary | ICD-10-CM | POA: Diagnosis not present

## 2023-10-10 DIAGNOSIS — R000 Tachycardia, unspecified: Secondary | ICD-10-CM | POA: Diagnosis not present

## 2023-10-10 DIAGNOSIS — R002 Palpitations: Secondary | ICD-10-CM | POA: Diagnosis not present

## 2023-10-16 DIAGNOSIS — M9901 Segmental and somatic dysfunction of cervical region: Secondary | ICD-10-CM | POA: Diagnosis not present

## 2023-10-16 DIAGNOSIS — M9907 Segmental and somatic dysfunction of upper extremity: Secondary | ICD-10-CM | POA: Diagnosis not present

## 2023-10-16 DIAGNOSIS — M542 Cervicalgia: Secondary | ICD-10-CM | POA: Diagnosis not present

## 2023-10-16 DIAGNOSIS — M9903 Segmental and somatic dysfunction of lumbar region: Secondary | ICD-10-CM | POA: Diagnosis not present

## 2023-10-16 DIAGNOSIS — M9902 Segmental and somatic dysfunction of thoracic region: Secondary | ICD-10-CM | POA: Diagnosis not present

## 2023-10-18 DIAGNOSIS — R0609 Other forms of dyspnea: Secondary | ICD-10-CM | POA: Diagnosis not present

## 2023-10-18 DIAGNOSIS — R000 Tachycardia, unspecified: Secondary | ICD-10-CM | POA: Insufficient documentation

## 2023-10-22 DIAGNOSIS — R922 Inconclusive mammogram: Secondary | ICD-10-CM | POA: Diagnosis not present

## 2023-10-22 DIAGNOSIS — R92321 Mammographic fibroglandular density, right breast: Secondary | ICD-10-CM | POA: Diagnosis not present

## 2023-10-22 DIAGNOSIS — R92323 Mammographic fibroglandular density, bilateral breasts: Secondary | ICD-10-CM | POA: Diagnosis not present

## 2023-10-22 DIAGNOSIS — N6315 Unspecified lump in the right breast, overlapping quadrants: Secondary | ICD-10-CM | POA: Diagnosis not present

## 2023-10-26 DIAGNOSIS — R0609 Other forms of dyspnea: Secondary | ICD-10-CM | POA: Diagnosis not present

## 2023-10-27 DIAGNOSIS — R21 Rash and other nonspecific skin eruption: Secondary | ICD-10-CM | POA: Diagnosis not present

## 2023-10-29 DIAGNOSIS — M9902 Segmental and somatic dysfunction of thoracic region: Secondary | ICD-10-CM | POA: Diagnosis not present

## 2023-10-29 DIAGNOSIS — M9903 Segmental and somatic dysfunction of lumbar region: Secondary | ICD-10-CM | POA: Diagnosis not present

## 2023-10-29 DIAGNOSIS — M542 Cervicalgia: Secondary | ICD-10-CM | POA: Diagnosis not present

## 2023-10-29 DIAGNOSIS — M9901 Segmental and somatic dysfunction of cervical region: Secondary | ICD-10-CM | POA: Diagnosis not present

## 2023-10-29 DIAGNOSIS — M9907 Segmental and somatic dysfunction of upper extremity: Secondary | ICD-10-CM | POA: Diagnosis not present

## 2023-11-12 DIAGNOSIS — M9907 Segmental and somatic dysfunction of upper extremity: Secondary | ICD-10-CM | POA: Diagnosis not present

## 2023-11-12 DIAGNOSIS — M9902 Segmental and somatic dysfunction of thoracic region: Secondary | ICD-10-CM | POA: Diagnosis not present

## 2023-11-12 DIAGNOSIS — M9901 Segmental and somatic dysfunction of cervical region: Secondary | ICD-10-CM | POA: Diagnosis not present

## 2023-11-12 DIAGNOSIS — M542 Cervicalgia: Secondary | ICD-10-CM | POA: Diagnosis not present

## 2023-11-12 DIAGNOSIS — M9903 Segmental and somatic dysfunction of lumbar region: Secondary | ICD-10-CM | POA: Diagnosis not present

## 2023-11-17 DIAGNOSIS — R0609 Other forms of dyspnea: Secondary | ICD-10-CM | POA: Diagnosis not present

## 2023-11-26 DIAGNOSIS — M9902 Segmental and somatic dysfunction of thoracic region: Secondary | ICD-10-CM | POA: Diagnosis not present

## 2023-11-26 DIAGNOSIS — M9903 Segmental and somatic dysfunction of lumbar region: Secondary | ICD-10-CM | POA: Diagnosis not present

## 2023-11-26 DIAGNOSIS — M542 Cervicalgia: Secondary | ICD-10-CM | POA: Diagnosis not present

## 2023-11-26 DIAGNOSIS — M9901 Segmental and somatic dysfunction of cervical region: Secondary | ICD-10-CM | POA: Diagnosis not present

## 2023-11-26 DIAGNOSIS — M9907 Segmental and somatic dysfunction of upper extremity: Secondary | ICD-10-CM | POA: Diagnosis not present

## 2023-12-10 DIAGNOSIS — M9901 Segmental and somatic dysfunction of cervical region: Secondary | ICD-10-CM | POA: Diagnosis not present

## 2023-12-10 DIAGNOSIS — M9902 Segmental and somatic dysfunction of thoracic region: Secondary | ICD-10-CM | POA: Diagnosis not present

## 2023-12-10 DIAGNOSIS — M9903 Segmental and somatic dysfunction of lumbar region: Secondary | ICD-10-CM | POA: Diagnosis not present

## 2023-12-10 DIAGNOSIS — M9907 Segmental and somatic dysfunction of upper extremity: Secondary | ICD-10-CM | POA: Diagnosis not present

## 2023-12-10 DIAGNOSIS — M542 Cervicalgia: Secondary | ICD-10-CM | POA: Diagnosis not present

## 2023-12-22 DIAGNOSIS — L218 Other seborrheic dermatitis: Secondary | ICD-10-CM | POA: Diagnosis not present

## 2023-12-22 DIAGNOSIS — B353 Tinea pedis: Secondary | ICD-10-CM | POA: Diagnosis not present

## 2023-12-22 DIAGNOSIS — L280 Lichen simplex chronicus: Secondary | ICD-10-CM | POA: Diagnosis not present

## 2023-12-22 DIAGNOSIS — B351 Tinea unguium: Secondary | ICD-10-CM | POA: Diagnosis not present

## 2023-12-23 DIAGNOSIS — M9901 Segmental and somatic dysfunction of cervical region: Secondary | ICD-10-CM | POA: Diagnosis not present

## 2023-12-23 DIAGNOSIS — M542 Cervicalgia: Secondary | ICD-10-CM | POA: Diagnosis not present

## 2023-12-23 DIAGNOSIS — M9903 Segmental and somatic dysfunction of lumbar region: Secondary | ICD-10-CM | POA: Diagnosis not present

## 2023-12-23 DIAGNOSIS — M9902 Segmental and somatic dysfunction of thoracic region: Secondary | ICD-10-CM | POA: Diagnosis not present

## 2023-12-29 DIAGNOSIS — M542 Cervicalgia: Secondary | ICD-10-CM | POA: Diagnosis not present

## 2023-12-29 DIAGNOSIS — M9901 Segmental and somatic dysfunction of cervical region: Secondary | ICD-10-CM | POA: Diagnosis not present

## 2023-12-29 DIAGNOSIS — M9907 Segmental and somatic dysfunction of upper extremity: Secondary | ICD-10-CM | POA: Diagnosis not present

## 2023-12-29 DIAGNOSIS — M9903 Segmental and somatic dysfunction of lumbar region: Secondary | ICD-10-CM | POA: Diagnosis not present

## 2023-12-29 DIAGNOSIS — M9902 Segmental and somatic dysfunction of thoracic region: Secondary | ICD-10-CM | POA: Diagnosis not present

## 2023-12-31 DIAGNOSIS — Z0001 Encounter for general adult medical examination with abnormal findings: Secondary | ICD-10-CM | POA: Diagnosis not present

## 2023-12-31 DIAGNOSIS — E063 Autoimmune thyroiditis: Secondary | ICD-10-CM | POA: Diagnosis not present

## 2023-12-31 DIAGNOSIS — E559 Vitamin D deficiency, unspecified: Secondary | ICD-10-CM | POA: Diagnosis not present

## 2023-12-31 DIAGNOSIS — Z79890 Hormone replacement therapy (postmenopausal): Secondary | ICD-10-CM | POA: Diagnosis not present

## 2023-12-31 DIAGNOSIS — Z8249 Family history of ischemic heart disease and other diseases of the circulatory system: Secondary | ICD-10-CM | POA: Diagnosis not present

## 2024-01-04 DIAGNOSIS — M9907 Segmental and somatic dysfunction of upper extremity: Secondary | ICD-10-CM | POA: Diagnosis not present

## 2024-01-04 DIAGNOSIS — M9903 Segmental and somatic dysfunction of lumbar region: Secondary | ICD-10-CM | POA: Diagnosis not present

## 2024-01-04 DIAGNOSIS — M9901 Segmental and somatic dysfunction of cervical region: Secondary | ICD-10-CM | POA: Diagnosis not present

## 2024-01-04 DIAGNOSIS — M9902 Segmental and somatic dysfunction of thoracic region: Secondary | ICD-10-CM | POA: Diagnosis not present

## 2024-01-04 DIAGNOSIS — M542 Cervicalgia: Secondary | ICD-10-CM | POA: Diagnosis not present

## 2024-01-06 DIAGNOSIS — M9903 Segmental and somatic dysfunction of lumbar region: Secondary | ICD-10-CM | POA: Diagnosis not present

## 2024-01-06 DIAGNOSIS — M9902 Segmental and somatic dysfunction of thoracic region: Secondary | ICD-10-CM | POA: Diagnosis not present

## 2024-01-06 DIAGNOSIS — M542 Cervicalgia: Secondary | ICD-10-CM | POA: Diagnosis not present

## 2024-01-06 DIAGNOSIS — M9901 Segmental and somatic dysfunction of cervical region: Secondary | ICD-10-CM | POA: Diagnosis not present

## 2024-01-06 DIAGNOSIS — M9907 Segmental and somatic dysfunction of upper extremity: Secondary | ICD-10-CM | POA: Diagnosis not present

## 2024-01-13 DIAGNOSIS — M9902 Segmental and somatic dysfunction of thoracic region: Secondary | ICD-10-CM | POA: Diagnosis not present

## 2024-01-13 DIAGNOSIS — M542 Cervicalgia: Secondary | ICD-10-CM | POA: Diagnosis not present

## 2024-01-13 DIAGNOSIS — M9901 Segmental and somatic dysfunction of cervical region: Secondary | ICD-10-CM | POA: Diagnosis not present

## 2024-01-13 DIAGNOSIS — M9903 Segmental and somatic dysfunction of lumbar region: Secondary | ICD-10-CM | POA: Diagnosis not present

## 2024-01-18 DIAGNOSIS — M9901 Segmental and somatic dysfunction of cervical region: Secondary | ICD-10-CM | POA: Diagnosis not present

## 2024-01-18 DIAGNOSIS — M542 Cervicalgia: Secondary | ICD-10-CM | POA: Diagnosis not present

## 2024-01-18 DIAGNOSIS — G4700 Insomnia, unspecified: Secondary | ICD-10-CM | POA: Diagnosis not present

## 2024-01-18 DIAGNOSIS — M9903 Segmental and somatic dysfunction of lumbar region: Secondary | ICD-10-CM | POA: Diagnosis not present

## 2024-01-18 DIAGNOSIS — R000 Tachycardia, unspecified: Secondary | ICD-10-CM | POA: Diagnosis not present

## 2024-01-18 DIAGNOSIS — R0683 Snoring: Secondary | ICD-10-CM | POA: Diagnosis not present

## 2024-01-18 DIAGNOSIS — M9902 Segmental and somatic dysfunction of thoracic region: Secondary | ICD-10-CM | POA: Diagnosis not present

## 2024-01-26 DIAGNOSIS — M9903 Segmental and somatic dysfunction of lumbar region: Secondary | ICD-10-CM | POA: Diagnosis not present

## 2024-01-26 DIAGNOSIS — M9901 Segmental and somatic dysfunction of cervical region: Secondary | ICD-10-CM | POA: Diagnosis not present

## 2024-01-26 DIAGNOSIS — M542 Cervicalgia: Secondary | ICD-10-CM | POA: Diagnosis not present

## 2024-01-26 DIAGNOSIS — M9902 Segmental and somatic dysfunction of thoracic region: Secondary | ICD-10-CM | POA: Diagnosis not present

## 2024-02-04 DIAGNOSIS — M9901 Segmental and somatic dysfunction of cervical region: Secondary | ICD-10-CM | POA: Diagnosis not present

## 2024-02-04 DIAGNOSIS — M9903 Segmental and somatic dysfunction of lumbar region: Secondary | ICD-10-CM | POA: Diagnosis not present

## 2024-02-04 DIAGNOSIS — M9902 Segmental and somatic dysfunction of thoracic region: Secondary | ICD-10-CM | POA: Diagnosis not present

## 2024-02-04 DIAGNOSIS — M542 Cervicalgia: Secondary | ICD-10-CM | POA: Diagnosis not present

## 2024-02-07 DIAGNOSIS — R0683 Snoring: Secondary | ICD-10-CM | POA: Diagnosis not present

## 2024-02-17 DIAGNOSIS — M9903 Segmental and somatic dysfunction of lumbar region: Secondary | ICD-10-CM | POA: Diagnosis not present

## 2024-02-17 DIAGNOSIS — M9902 Segmental and somatic dysfunction of thoracic region: Secondary | ICD-10-CM | POA: Diagnosis not present

## 2024-02-17 DIAGNOSIS — M9901 Segmental and somatic dysfunction of cervical region: Secondary | ICD-10-CM | POA: Diagnosis not present

## 2024-02-17 DIAGNOSIS — M542 Cervicalgia: Secondary | ICD-10-CM | POA: Diagnosis not present

## 2024-02-24 DIAGNOSIS — G4733 Obstructive sleep apnea (adult) (pediatric): Secondary | ICD-10-CM | POA: Diagnosis not present

## 2024-03-01 DIAGNOSIS — G4719 Other hypersomnia: Secondary | ICD-10-CM | POA: Diagnosis not present

## 2024-03-01 DIAGNOSIS — G4733 Obstructive sleep apnea (adult) (pediatric): Secondary | ICD-10-CM | POA: Diagnosis not present

## 2024-03-03 DIAGNOSIS — M9901 Segmental and somatic dysfunction of cervical region: Secondary | ICD-10-CM | POA: Diagnosis not present

## 2024-03-03 DIAGNOSIS — M9902 Segmental and somatic dysfunction of thoracic region: Secondary | ICD-10-CM | POA: Diagnosis not present

## 2024-03-03 DIAGNOSIS — M542 Cervicalgia: Secondary | ICD-10-CM | POA: Diagnosis not present

## 2024-03-03 DIAGNOSIS — M9903 Segmental and somatic dysfunction of lumbar region: Secondary | ICD-10-CM | POA: Diagnosis not present

## 2024-03-17 DIAGNOSIS — M9901 Segmental and somatic dysfunction of cervical region: Secondary | ICD-10-CM | POA: Diagnosis not present

## 2024-03-17 DIAGNOSIS — M542 Cervicalgia: Secondary | ICD-10-CM | POA: Diagnosis not present

## 2024-03-17 DIAGNOSIS — M9903 Segmental and somatic dysfunction of lumbar region: Secondary | ICD-10-CM | POA: Diagnosis not present

## 2024-03-17 DIAGNOSIS — M9902 Segmental and somatic dysfunction of thoracic region: Secondary | ICD-10-CM | POA: Diagnosis not present

## 2024-04-03 ENCOUNTER — Encounter: Payer: Self-pay | Admitting: Neurology

## 2024-04-04 ENCOUNTER — Ambulatory Visit (INDEPENDENT_AMBULATORY_CARE_PROVIDER_SITE_OTHER): Admitting: Neurology

## 2024-04-04 ENCOUNTER — Encounter: Payer: Self-pay | Admitting: Neurology

## 2024-04-04 VITALS — BP 137/84 | HR 84 | Resp 16 | Wt 210.0 lb

## 2024-04-04 DIAGNOSIS — R4189 Other symptoms and signs involving cognitive functions and awareness: Secondary | ICD-10-CM | POA: Insufficient documentation

## 2024-04-04 DIAGNOSIS — G4733 Obstructive sleep apnea (adult) (pediatric): Secondary | ICD-10-CM | POA: Diagnosis not present

## 2024-04-04 DIAGNOSIS — R5383 Other fatigue: Secondary | ICD-10-CM | POA: Diagnosis not present

## 2024-04-04 DIAGNOSIS — G43709 Chronic migraine without aura, not intractable, without status migrainosus: Secondary | ICD-10-CM | POA: Diagnosis not present

## 2024-04-04 MED ORDER — SUMATRIPTAN SUCCINATE 50 MG PO TABS: 50.0000 mg | ORAL_TABLET | ORAL | 6 refills | Status: DC | PRN

## 2024-04-04 NOTE — Progress Notes (Signed)
 Chief Complaint  Patient presents with   New Patient (Initial Visit)    Daisy Taylor, ALONE, NP/[facility] @ Nicolina Repress MD [phone] fog, dizziness, fatigue, concern for dysautonomia: PT STATED THAT SHE THINKS SHE MAY HAVE POTTS, SHE STATED THAT [place]ING [place] INCREASES HEART RATE AND HAS SEEN CARDIOLOGIST. PT ALSO MENTIONED BRAIN FOG, WORD FINDING DIFFICULTY (DURING MEETINGS). FORGETFULNESS AS TO WHERE SHE IS GOING. INCREASED HEAT INTOLERANCE. PERSISTENT HA'S AND MIGRAINES WITH VARYING INTENSITY.  MOCA SCORE WAS 27      ASSESSMENT AND PLAN  Daisy Taylor is a 43 y.o. female   Worsening chronic migraine Obstructive sleep apnea Memory loss  MRI of the brain to rule out structural abnormality  Imitrex as needed  DIAGNOSTIC DATA (LABS, IMAGING, TESTING) - I reviewed patient records, labs, notes, testing and imaging myself where available.   MEDICAL HISTORY:  Daisy Taylor is a 43 year old female, seen in request by her primary care for [facility] Dr. Repress, Vyvyan, for evaluation of constellation of complaints, word finding difficulties, lack of stamina, initial evaluation was on April 04, 2024   History is obtained from the patient and review of electronic medical records. I personally reviewed pertinent available imaging films in PACS.   PMHx of   She works from home, but used to be very physically active, walking 5 to 7 miles daily, she had a trip to [place] for [place] game in August 2024, came back with COVID infection, suffered fatigue, not feeling well for couple weeks followed by bronchitis for couple months, since then, she is not the same, she tends to feel fatigue, lack of stamina, feel winded walking just couple miles, even with a mild incline, she developed shortness of breath rapid heart rate to 170 quickly  In addition she complains of more frequent headaches, about couple times each month, left lateralized severe pounding headache, with light, noise sensitivity,  lasting hours today  She work at the bank, due to time difference, 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, complains of difficulty falling into sleep and stay in asleep, frequent awakening, recent sleep study confirmed obstructive sleep apnea,dental device was suggested  She was evaluated by cardiology recently, no cardiac abnormality found, including 14 days cardiac monitoring,  She was evaluated by holistic medicine, extensive laboratory evaluation from December 31, 2023, A1c of 6.4, mild elevated, normal creatinine 0.89, LDL 93, normal TSH 1.76, T47.7, free T33.1, negative thyroid peroxidase, thyroglobulin antibody, vitamin D was within normal limits 63, normal copper  131, B12 1241, folic acid 22, hemoglobin of 14.9,  She complains of brain fog, slow reaction time, word finding difficulties, short-term memory loss  PHYSICAL EXAM:   Vitals:   04/04/24 0823 04/04/24 0828  BP: (!) 142/93 137/84  Pulse: 86   Resp: 16   Weight: 210 lb (95.3 kg)    Not recorded     Body mass index is 33.89 kg/m.  PHYSICAL EXAMNIATION:  Gen: NAD, conversant, well nourised, well groomed                     Cardiovascular: Regular rate rhythm, no peripheral edema, warm, nontender. Eyes: Conjunctivae clear without exudates or hemorrhage Neck: Supple, no carotid bruits. Pulmonary: Clear to auscultation bilaterally   NEUROLOGICAL EXAM:  MENTAL STATUS: Speech/cognition: Awake, alert, oriented to history taking and casual conversation CRANIAL NERVES: CN II: Visual fields are full to confrontation. Pupils are round equal and briskly reactive to light.  Funduscopy examination were normal CN III,  IV, VI: extraocular movement are normal. No ptosis. CN V: Facial sensation is intact to light touch CN VII: Face is symmetric with normal eye closure  CN VIII: Hearing is normal to causal conversation. CN IX, X: Phonation is normal. CN XI: Head turning and shoulder shrug are intact  MOTOR: There is no  pronator drift of out-stretched arms. Muscle bulk and tone are normal. Muscle strength is normal.  REFLEXES: Reflexes are 1 and symmetric at the biceps, triceps, knees, and ankles. Plantar responses are flexor.  SENSORY: Intact to light touch, pinprick and vibratory sensation are intact in fingers and toes.  COORDINATION: There is no trunk or limb dysmetria noted.  GAIT/STANCE: Posture is normal. Gait is steady    REVIEW OF SYSTEMS:  Full 14 system review of systems performed and notable only for as above All other review of systems were negative.   ALLERGIES: Allergies  Allergen Reactions   Bee Venom Swelling    HOME MEDICATIONS: Current Outpatient Medications  Medication Sig Dispense Refill   glycopyrrolate (ROBINUL) 1 MG tablet Take 1 mg by mouth daily as needed.     Multiple Vitamin (MULTIVITAMIN) tablet Take 1 tablet by mouth daily.     Clobetasol Propionate 0.05 % shampoo Apply 1 Application topically as needed.     No current facility-administered medications for this visit.    PAST MEDICAL HISTORY: History reviewed. No pertinent past medical history.  PAST SURGICAL HISTORY: Past Surgical History:  Procedure Laterality Date   MINOR HEMORRHOIDECTOMY  2015   REPAIR KNEE LIGAMENT Left 2018   TONSILLECTOMY  1986    FAMILY HISTORY: Family History  Problem Relation Age of Onset   Subarachnoid hemorrhage Father    Dementia Paternal Grandmother     SOCIAL HISTORY: Social History   Socioeconomic History   Marital status: Single    Spouse name: Not on file   Number of children: 0   Years of education: Not on file   Highest education level: Master's degree (e.g., MA, MS, MEng, MEd, MSW, MBA)  Occupational History   Not on file  Tobacco Use   Smoking status: Never    Passive exposure: Never   Smokeless tobacco: Never  Vaping Use   Vaping status: Never Used  Substance and Sexual Activity   Alcohol use: Yes    Alcohol/week: 1.0 standard drink of  alcohol    Types: 1 Standard drinks or equivalent per week   Drug use: Not Currently   Sexual activity: Not Currently  Other Topics Concern   Not on file  Social History Narrative   Not on file   Social Drivers of Health   Financial Resource Strain: Not on file  Food Insecurity: Not on file  Transportation Needs: Not on file  Physical Activity: Not on file  Stress: Not on file  Social Connections: Unknown (09/17/2022)   [facility] from [facility]   Social Network    Social Network: Not on file  Intimate Partner Violence: Unknown (09/17/2022)   Received from [facility]   HITS    Physically Hurt: Not on file    Insult or Talk Down To: Not on file    Threaten Physical Harm: Not on file    Scream or Curse: Not on file      Modena Callander, M.D. Ph.D.  [facility] [address] [place], [place] [address] Ph: [phone] Fax: [phone]  CC:  Austin Nutley, MD [phone] [address]ICA[address] [place],  [place] [address]  Sun, Vyvyan, MD

## 2024-04-26 ENCOUNTER — Ambulatory Visit

## 2024-04-26 DIAGNOSIS — R5383 Other fatigue: Secondary | ICD-10-CM

## 2024-04-26 DIAGNOSIS — G43709 Chronic migraine without aura, not intractable, without status migrainosus: Secondary | ICD-10-CM | POA: Diagnosis not present

## 2024-04-26 DIAGNOSIS — R4189 Other symptoms and signs involving cognitive functions and awareness: Secondary | ICD-10-CM | POA: Diagnosis not present

## 2024-04-28 ENCOUNTER — Ambulatory Visit (INDEPENDENT_AMBULATORY_CARE_PROVIDER_SITE_OTHER): Payer: Self-pay | Admitting: Neurology

## 2024-04-28 DIAGNOSIS — M542 Cervicalgia: Secondary | ICD-10-CM | POA: Diagnosis not present

## 2024-04-28 DIAGNOSIS — G43709 Chronic migraine without aura, not intractable, without status migrainosus: Secondary | ICD-10-CM

## 2024-04-28 DIAGNOSIS — M9902 Segmental and somatic dysfunction of thoracic region: Secondary | ICD-10-CM | POA: Diagnosis not present

## 2024-04-28 DIAGNOSIS — M9903 Segmental and somatic dysfunction of lumbar region: Secondary | ICD-10-CM | POA: Diagnosis not present

## 2024-04-28 DIAGNOSIS — M9901 Segmental and somatic dysfunction of cervical region: Secondary | ICD-10-CM | POA: Diagnosis not present

## 2024-04-30 ENCOUNTER — Other Ambulatory Visit: Payer: Self-pay | Admitting: Medical Genetics

## 2024-05-01 DIAGNOSIS — G4733 Obstructive sleep apnea (adult) (pediatric): Secondary | ICD-10-CM | POA: Diagnosis not present

## 2024-05-02 ENCOUNTER — Other Ambulatory Visit (HOSPITAL_COMMUNITY)

## 2024-05-02 ENCOUNTER — Other Ambulatory Visit (HOSPITAL_COMMUNITY)
Admission: RE | Admit: 2024-05-02 | Discharge: 2024-05-02 | Disposition: A | Discharge status: Home / Self Care | Payer: Self-pay | Source: Ambulatory Visit | Attending: Medical Genetics | Admitting: Medical Genetics

## 2024-05-03 ENCOUNTER — Other Ambulatory Visit (HOSPITAL_COMMUNITY)

## 2024-05-03 MED ORDER — SUMATRIPTAN SUCCINATE 100 MG PO TABS: 100.0000 mg | ORAL_TABLET | ORAL | 11 refills | Status: AC | PRN

## 2024-05-03 MED ORDER — ONDANSETRON 4 MG PO TBDP: 4.0000 mg | ORAL_TABLET | Freq: Three times a day (TID) | ORAL | 6 refills | Status: AC | PRN

## 2024-05-03 MED ORDER — TIZANIDINE HCL 4 MG PO TABS: 4.0000 mg | ORAL_TABLET | Freq: Four times a day (QID) | ORAL | 6 refills | Status: AC | PRN

## 2024-05-03 NOTE — Telephone Encounter (Signed)
 Meds ordered this encounter  Medications   SUMAtriptan  (IMITREX ) 100 MG tablet    Sig: Take 1 tablet (100 mg total) by mouth every 2 (two) hours as needed. May repeat in 2 hours if headache persists or recurs.    Dispense:  10 tablet    Refill:  11   ondansetron  (ZOFRAN -ODT) 4 MG disintegrating tablet    Sig: Take 1 tablet (4 mg total) by mouth every 8 (eight) hours as needed.    Dispense:  20 tablet    Refill:  6   tiZANidine  (ZANAFLEX ) 4 MG tablet    Sig: Take 1 tablet (4 mg total) by mouth every 6 (six) hours as needed.    Dispense:  30 tablet    Refill:  6      I called patient, she is going through a lot of stress, helping her mother going through her cancer treatment  She complains of increased migraine over the past 9 months, sometimes with visual changes, blurry vision, blocking her visual field, prolonged severe headaches, Imitrex  50 mg as needed was helpful, but she often needs second dose   Will increase Imitrex  to 100 mg as needed, may combine with Zofran , tizanidine  for prolonged severe headaches,  She is not ready for daily preventative medications  Please see the MyChart message reply(ies) for my assessment and plan.    This patient gave consent for this Medical Advice Message and is aware that it may result in a bill to [employer], as well as the possibility of receiving a bill for a co-payment or deductible. They are an established patient, but are not seeking medical advice exclusively about a problem treated during an in person or video visit in the last seven days. I did not recommend an in person or video visit within seven days of my reply.    I spent a total of 15 minutes cumulative time within 7 days through [employer].  Modena Callander, MD

## 2024-05-05 DIAGNOSIS — L74519 Primary focal hyperhidrosis, unspecified: Secondary | ICD-10-CM | POA: Diagnosis not present

## 2024-05-05 DIAGNOSIS — E236 Other disorders of pituitary gland: Secondary | ICD-10-CM | POA: Diagnosis not present

## 2024-05-05 DIAGNOSIS — G43909 Migraine, unspecified, not intractable, without status migrainosus: Secondary | ICD-10-CM | POA: Diagnosis not present

## 2024-05-05 DIAGNOSIS — R5383 Other fatigue: Secondary | ICD-10-CM | POA: Diagnosis not present

## 2024-05-08 ENCOUNTER — Other Ambulatory Visit (HOSPITAL_COMMUNITY): Payer: Self-pay

## 2024-05-11 DIAGNOSIS — M9901 Segmental and somatic dysfunction of cervical region: Secondary | ICD-10-CM | POA: Diagnosis not present

## 2024-05-11 DIAGNOSIS — M542 Cervicalgia: Secondary | ICD-10-CM | POA: Diagnosis not present

## 2024-05-11 DIAGNOSIS — M9903 Segmental and somatic dysfunction of lumbar region: Secondary | ICD-10-CM | POA: Diagnosis not present

## 2024-05-11 DIAGNOSIS — E236 Other disorders of pituitary gland: Secondary | ICD-10-CM | POA: Diagnosis not present

## 2024-05-11 DIAGNOSIS — M9902 Segmental and somatic dysfunction of thoracic region: Secondary | ICD-10-CM | POA: Diagnosis not present

## 2024-05-12 LAB — GENECONNECT MOLECULAR SCREEN: Genetic Analysis Overall Interpretation: NEGATIVE

## 2024-05-17 DIAGNOSIS — R000 Tachycardia, unspecified: Secondary | ICD-10-CM | POA: Diagnosis not present

## 2024-05-17 DIAGNOSIS — E236 Other disorders of pituitary gland: Secondary | ICD-10-CM | POA: Diagnosis not present

## 2024-05-25 DIAGNOSIS — R825 Elevated urine levels of drugs, medicaments and biological substances: Secondary | ICD-10-CM | POA: Diagnosis not present

## 2024-05-30 DIAGNOSIS — G4733 Obstructive sleep apnea (adult) (pediatric): Secondary | ICD-10-CM | POA: Diagnosis not present

## 2024-06-29 DIAGNOSIS — F4323 Adjustment disorder with mixed anxiety and depressed mood: Secondary | ICD-10-CM | POA: Diagnosis not present

## 2024-07-07 DIAGNOSIS — Z0001 Encounter for general adult medical examination with abnormal findings: Secondary | ICD-10-CM | POA: Diagnosis not present

## 2024-07-07 DIAGNOSIS — E063 Autoimmune thyroiditis: Secondary | ICD-10-CM | POA: Diagnosis not present

## 2024-07-07 DIAGNOSIS — Z79890 Hormone replacement therapy (postmenopausal): Secondary | ICD-10-CM | POA: Diagnosis not present

## 2024-07-07 DIAGNOSIS — Z8249 Family history of ischemic heart disease and other diseases of the circulatory system: Secondary | ICD-10-CM | POA: Diagnosis not present

## 2024-07-20 DIAGNOSIS — F4323 Adjustment disorder with mixed anxiety and depressed mood: Secondary | ICD-10-CM | POA: Diagnosis not present

## 2024-07-28 DIAGNOSIS — M9902 Segmental and somatic dysfunction of thoracic region: Secondary | ICD-10-CM | POA: Diagnosis not present

## 2024-07-28 DIAGNOSIS — M542 Cervicalgia: Secondary | ICD-10-CM | POA: Diagnosis not present

## 2024-07-28 DIAGNOSIS — M9903 Segmental and somatic dysfunction of lumbar region: Secondary | ICD-10-CM | POA: Diagnosis not present

## 2024-07-28 DIAGNOSIS — M9901 Segmental and somatic dysfunction of cervical region: Secondary | ICD-10-CM | POA: Diagnosis not present

## 2024-08-02 DIAGNOSIS — R051 Acute cough: Secondary | ICD-10-CM | POA: Diagnosis not present

## 2024-08-02 DIAGNOSIS — J029 Acute pharyngitis, unspecified: Secondary | ICD-10-CM | POA: Diagnosis not present

## 2024-08-02 DIAGNOSIS — Z20822 Contact with and (suspected) exposure to covid-19: Secondary | ICD-10-CM | POA: Diagnosis not present

## 2024-08-03 DIAGNOSIS — F4323 Adjustment disorder with mixed anxiety and depressed mood: Secondary | ICD-10-CM | POA: Diagnosis not present

## 2024-08-09 DIAGNOSIS — M9903 Segmental and somatic dysfunction of lumbar region: Secondary | ICD-10-CM | POA: Diagnosis not present

## 2024-08-09 DIAGNOSIS — M9901 Segmental and somatic dysfunction of cervical region: Secondary | ICD-10-CM | POA: Diagnosis not present

## 2024-08-09 DIAGNOSIS — M9902 Segmental and somatic dysfunction of thoracic region: Secondary | ICD-10-CM | POA: Diagnosis not present

## 2024-08-09 DIAGNOSIS — M542 Cervicalgia: Secondary | ICD-10-CM | POA: Diagnosis not present

## 2024-08-16 DIAGNOSIS — F4323 Adjustment disorder with mixed anxiety and depressed mood: Secondary | ICD-10-CM | POA: Diagnosis not present

## 2024-08-30 DIAGNOSIS — B9689 Other specified bacterial agents as the cause of diseases classified elsewhere: Secondary | ICD-10-CM | POA: Diagnosis not present

## 2024-08-30 DIAGNOSIS — J208 Acute bronchitis due to other specified organisms: Secondary | ICD-10-CM | POA: Diagnosis not present

## 2024-09-01 DIAGNOSIS — F4323 Adjustment disorder with mixed anxiety and depressed mood: Secondary | ICD-10-CM | POA: Diagnosis not present

## 2024-09-11 ENCOUNTER — Encounter: Payer: Self-pay | Admitting: Psychology

## 2024-09-21 DIAGNOSIS — F4323 Adjustment disorder with mixed anxiety and depressed mood: Secondary | ICD-10-CM | POA: Diagnosis not present

## 2024-09-21 DIAGNOSIS — R052 Subacute cough: Secondary | ICD-10-CM | POA: Diagnosis not present

## 2024-09-28 DIAGNOSIS — F4323 Adjustment disorder with mixed anxiety and depressed mood: Secondary | ICD-10-CM | POA: Diagnosis not present

## 2024-11-01 ENCOUNTER — Encounter: Attending: Psychology | Admitting: Psychology

## 2024-11-01 ENCOUNTER — Encounter: Payer: Self-pay | Admitting: Psychology

## 2024-11-01 DIAGNOSIS — F411 Generalized anxiety disorder: Secondary | ICD-10-CM | POA: Insufficient documentation
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
